# Patient Record
Sex: Female | Born: 1955
Health system: Southern US, Community
[De-identification: ages and names within clinical notes are randomized; demographics above are authoritative.]

## PROBLEM LIST (undated history)

## (undated) DIAGNOSIS — E785 Hyperlipidemia, unspecified: Secondary | ICD-10-CM

## (undated) DIAGNOSIS — E079 Disorder of thyroid, unspecified: Secondary | ICD-10-CM

## (undated) DIAGNOSIS — M1990 Unspecified osteoarthritis, unspecified site: Secondary | ICD-10-CM

## (undated) HISTORY — PX: JOINT REPLACEMENT: SHX530

## (undated) HISTORY — DX: Unspecified osteoarthritis, unspecified site: M19.90

## (undated) HISTORY — DX: Disorder of thyroid, unspecified: E07.9

## (undated) HISTORY — PX: TUBAL LIGATION: SHX77

## (undated) HISTORY — DX: Hyperlipidemia, unspecified: E78.5

---

## 1991-05-01 ENCOUNTER — Encounter: Payer: Self-pay | Admitting: Gastroenterology

## 1991-05-04 ENCOUNTER — Encounter: Payer: Self-pay | Admitting: Gastroenterology

## 2001-01-30 ENCOUNTER — Other Ambulatory Visit: Admission: RE | Admit: 2001-01-30 | Discharge: 2001-01-30 | Payer: Self-pay | Admitting: Family Medicine

## 2002-01-31 ENCOUNTER — Other Ambulatory Visit: Admission: RE | Admit: 2002-01-31 | Discharge: 2002-01-31 | Payer: Self-pay | Admitting: Family Medicine

## 2002-02-20 ENCOUNTER — Encounter: Admission: RE | Admit: 2002-02-20 | Discharge: 2002-02-20 | Payer: Self-pay | Admitting: Family Medicine

## 2002-02-20 ENCOUNTER — Encounter: Payer: Self-pay | Admitting: Family Medicine

## 2004-01-17 ENCOUNTER — Ambulatory Visit: Payer: Self-pay | Admitting: Family Medicine

## 2004-03-10 ENCOUNTER — Other Ambulatory Visit: Admission: RE | Admit: 2004-03-10 | Discharge: 2004-03-10 | Payer: Self-pay | Admitting: Family Medicine

## 2004-03-10 ENCOUNTER — Ambulatory Visit: Payer: Self-pay | Admitting: Family Medicine

## 2004-03-10 ENCOUNTER — Encounter: Admission: RE | Admit: 2004-03-10 | Discharge: 2004-03-10 | Payer: Self-pay | Admitting: Family Medicine

## 2004-07-03 ENCOUNTER — Ambulatory Visit: Payer: Self-pay | Admitting: Family Medicine

## 2005-12-21 ENCOUNTER — Encounter: Payer: Self-pay | Admitting: Family Medicine

## 2005-12-21 ENCOUNTER — Other Ambulatory Visit: Admission: RE | Admit: 2005-12-21 | Discharge: 2005-12-21 | Payer: Self-pay | Admitting: Family Medicine

## 2005-12-21 ENCOUNTER — Ambulatory Visit: Payer: Self-pay | Admitting: Family Medicine

## 2006-01-12 ENCOUNTER — Ambulatory Visit: Payer: Self-pay | Admitting: Family Medicine

## 2006-01-17 ENCOUNTER — Encounter: Admission: RE | Admit: 2006-01-17 | Discharge: 2006-01-17 | Payer: Self-pay | Admitting: Family Medicine

## 2007-02-21 ENCOUNTER — Ambulatory Visit: Payer: Self-pay | Admitting: Family Medicine

## 2007-03-14 ENCOUNTER — Telehealth: Payer: Self-pay | Admitting: Family Medicine

## 2007-03-16 ENCOUNTER — Telehealth: Payer: Self-pay | Admitting: Family Medicine

## 2007-03-20 ENCOUNTER — Ambulatory Visit: Payer: Self-pay | Admitting: Family Medicine

## 2007-07-28 ENCOUNTER — Encounter: Payer: Self-pay | Admitting: Family Medicine

## 2007-07-29 ENCOUNTER — Encounter: Payer: Self-pay | Admitting: Family Medicine

## 2007-08-02 ENCOUNTER — Encounter: Payer: Self-pay | Admitting: Family Medicine

## 2007-08-02 DIAGNOSIS — E785 Hyperlipidemia, unspecified: Secondary | ICD-10-CM | POA: Insufficient documentation

## 2007-08-02 DIAGNOSIS — L259 Unspecified contact dermatitis, unspecified cause: Secondary | ICD-10-CM

## 2007-08-02 DIAGNOSIS — N6019 Diffuse cystic mastopathy of unspecified breast: Secondary | ICD-10-CM | POA: Insufficient documentation

## 2007-08-03 ENCOUNTER — Ambulatory Visit: Payer: Self-pay | Admitting: Family Medicine

## 2007-08-04 LAB — CONVERTED CEMR LAB
LH: 48.2 milliintl units/mL
hCG, Beta Chain, Quant, S: 6.3 milliintl units/mL

## 2007-08-17 ENCOUNTER — Ambulatory Visit: Payer: Self-pay | Admitting: Obstetrics & Gynecology

## 2007-08-23 ENCOUNTER — Encounter (INDEPENDENT_AMBULATORY_CARE_PROVIDER_SITE_OTHER): Payer: Self-pay | Admitting: *Deleted

## 2007-08-23 ENCOUNTER — Encounter: Admission: RE | Admit: 2007-08-23 | Discharge: 2007-08-23 | Payer: Self-pay | Admitting: Family Medicine

## 2007-08-31 ENCOUNTER — Ambulatory Visit (HOSPITAL_COMMUNITY): Admission: RE | Admit: 2007-08-31 | Discharge: 2007-08-31 | Payer: Self-pay | Admitting: Gynecology

## 2007-09-26 ENCOUNTER — Telehealth: Payer: Self-pay | Admitting: Family Medicine

## 2007-09-28 ENCOUNTER — Ambulatory Visit: Payer: Self-pay | Admitting: Obstetrics & Gynecology

## 2007-10-05 ENCOUNTER — Telehealth (INDEPENDENT_AMBULATORY_CARE_PROVIDER_SITE_OTHER): Payer: Self-pay | Admitting: *Deleted

## 2008-10-15 ENCOUNTER — Ambulatory Visit: Payer: Self-pay | Admitting: Family Medicine

## 2008-12-04 ENCOUNTER — Ambulatory Visit: Payer: Self-pay | Admitting: Family Medicine

## 2008-12-05 LAB — CONVERTED CEMR LAB
Basophils Relative: 0.6 % (ref 0.0–3.0)
Eosinophils Absolute: 0.1 10*3/uL (ref 0.0–0.7)
HCT: 40.6 % (ref 36.0–46.0)
Hemoglobin: 14 g/dL (ref 12.0–15.0)
Monocytes Absolute: 0.6 10*3/uL (ref 0.1–1.0)
Neutrophils Relative %: 64.7 % (ref 43.0–77.0)
RDW: 12.7 % (ref 11.5–14.6)
WBC: 7.5 10*3/uL (ref 4.5–10.5)

## 2008-12-19 ENCOUNTER — Encounter: Payer: Self-pay | Admitting: Family Medicine

## 2009-02-15 DIAGNOSIS — K863 Pseudocyst of pancreas: Secondary | ICD-10-CM

## 2009-02-15 HISTORY — DX: Pseudocyst of pancreas: K86.3

## 2009-03-10 ENCOUNTER — Ambulatory Visit: Payer: Self-pay | Admitting: Family Medicine

## 2009-03-10 LAB — CONVERTED CEMR LAB
Bilirubin Urine: NEGATIVE
Nitrite: NEGATIVE
Specific Gravity, Urine: 1.025
Urobilinogen, UA: 0.2
WBC Urine, dipstick: NEGATIVE
pH: 6

## 2009-03-11 ENCOUNTER — Encounter: Payer: Self-pay | Admitting: Family Medicine

## 2009-03-13 ENCOUNTER — Ambulatory Visit: Payer: Self-pay | Admitting: Family Medicine

## 2009-03-13 DIAGNOSIS — R3129 Other microscopic hematuria: Secondary | ICD-10-CM

## 2009-03-13 LAB — CONVERTED CEMR LAB
Nitrite: NEGATIVE
Specific Gravity, Urine: >=1.03
Urobilinogen, UA: 0.2
WBC Urine, dipstick: NEGATIVE

## 2009-03-25 ENCOUNTER — Encounter: Payer: Self-pay | Admitting: Family Medicine

## 2009-03-28 ENCOUNTER — Encounter: Payer: Self-pay | Admitting: Family Medicine

## 2009-03-31 ENCOUNTER — Telehealth: Payer: Self-pay | Admitting: Family Medicine

## 2009-04-03 ENCOUNTER — Telehealth: Payer: Self-pay | Admitting: Internal Medicine

## 2009-04-07 ENCOUNTER — Ambulatory Visit: Payer: Self-pay | Admitting: Internal Medicine

## 2009-04-07 ENCOUNTER — Encounter (INDEPENDENT_AMBULATORY_CARE_PROVIDER_SITE_OTHER): Payer: Self-pay | Admitting: *Deleted

## 2009-04-07 DIAGNOSIS — R933 Abnormal findings on diagnostic imaging of other parts of digestive tract: Secondary | ICD-10-CM | POA: Insufficient documentation

## 2009-04-08 ENCOUNTER — Telehealth (INDEPENDENT_AMBULATORY_CARE_PROVIDER_SITE_OTHER): Payer: Self-pay | Admitting: *Deleted

## 2009-04-15 ENCOUNTER — Other Ambulatory Visit: Admission: RE | Admit: 2009-04-15 | Discharge: 2009-04-15 | Payer: Self-pay | Admitting: Family Medicine

## 2009-04-15 ENCOUNTER — Ambulatory Visit: Payer: Self-pay | Admitting: Family Medicine

## 2009-04-17 LAB — CONVERTED CEMR LAB
ALT: 23 units/L (ref 0–35)
AST: 19 units/L (ref 0–37)
Basophils Relative: 0.7 % (ref 0.0–3.0)
Bilirubin, Direct: 0.2 mg/dL (ref 0.0–0.3)
Chloride: 107 meq/L (ref 96–112)
Direct LDL: 164.4 mg/dL
Eosinophils Relative: 0.7 % (ref 0.0–5.0)
GFR calc non Af Amer: 92.58 mL/min (ref >60)
HCT: 40.8 % (ref 36.0–46.0)
Lymphs Abs: 1.6 10*3/uL (ref 0.7–4.0)
MCV: 89 fL (ref 78.0–100.0)
Monocytes Absolute: 0.4 10*3/uL (ref 0.1–1.0)
Monocytes Relative: 5.5 % (ref 3.0–12.0)
Neutrophils Relative %: 70.5 % (ref 43.0–77.0)
Potassium: 3.7 meq/L (ref 3.5–5.1)
RBC: 4.58 M/uL (ref 3.87–5.11)
Total CHOL/HDL Ratio: 4
Total Protein: 7.1 g/dL (ref 6.0–8.3)
VLDL: 17.2 mg/dL (ref 0.0–40.0)
WBC: 7 10*3/uL (ref 4.5–10.5)

## 2009-04-22 ENCOUNTER — Ambulatory Visit (HOSPITAL_COMMUNITY): Admission: RE | Admit: 2009-04-22 | Discharge: 2009-04-22 | Payer: Self-pay | Admitting: Gastroenterology

## 2009-04-22 ENCOUNTER — Encounter: Payer: Self-pay | Admitting: Family Medicine

## 2009-04-22 ENCOUNTER — Ambulatory Visit: Payer: Self-pay | Admitting: Gastroenterology

## 2009-04-23 ENCOUNTER — Encounter: Payer: Self-pay | Admitting: Gastroenterology

## 2009-04-30 ENCOUNTER — Telehealth: Payer: Self-pay | Admitting: Internal Medicine

## 2009-05-01 ENCOUNTER — Encounter: Payer: Self-pay | Admitting: Gastroenterology

## 2009-05-05 ENCOUNTER — Encounter (INDEPENDENT_AMBULATORY_CARE_PROVIDER_SITE_OTHER): Payer: Self-pay | Admitting: *Deleted

## 2009-05-08 ENCOUNTER — Encounter (INDEPENDENT_AMBULATORY_CARE_PROVIDER_SITE_OTHER): Payer: Self-pay | Admitting: *Deleted

## 2009-05-09 ENCOUNTER — Telehealth: Payer: Self-pay | Admitting: Internal Medicine

## 2009-05-28 ENCOUNTER — Encounter (INDEPENDENT_AMBULATORY_CARE_PROVIDER_SITE_OTHER): Payer: Self-pay | Admitting: *Deleted

## 2009-05-30 ENCOUNTER — Ambulatory Visit: Payer: Self-pay | Admitting: Internal Medicine

## 2009-06-04 ENCOUNTER — Ambulatory Visit: Payer: Self-pay | Admitting: Internal Medicine

## 2009-06-04 HISTORY — PX: COLONOSCOPY: SHX174

## 2009-08-14 ENCOUNTER — Ambulatory Visit: Payer: Self-pay | Admitting: Family Medicine

## 2009-08-14 LAB — CONVERTED CEMR LAB
ALT: 19 units/L (ref 0–35)
Cholesterol: 237 mg/dL — ABNORMAL HIGH (ref 0–200)
Total CHOL/HDL Ratio: 5
Triglycerides: 86 mg/dL (ref 0.0–149.0)
VLDL: 17.2 mg/dL (ref 0.0–40.0)

## 2009-08-27 ENCOUNTER — Ambulatory Visit: Payer: Self-pay | Admitting: Family Medicine

## 2009-09-24 ENCOUNTER — Ambulatory Visit: Payer: Self-pay | Admitting: Family Medicine

## 2009-09-24 DIAGNOSIS — R21 Rash and other nonspecific skin eruption: Secondary | ICD-10-CM

## 2009-10-13 ENCOUNTER — Telehealth: Payer: Self-pay | Admitting: Family Medicine

## 2009-10-16 ENCOUNTER — Ambulatory Visit: Payer: Self-pay | Admitting: Family Medicine

## 2009-11-27 ENCOUNTER — Ambulatory Visit: Payer: Self-pay | Admitting: Family Medicine

## 2009-12-02 LAB — CONVERTED CEMR LAB
ALT: 20 units/L (ref 0–35)
AST: 20 units/L (ref 0–37)
Cholesterol: 212 mg/dL — ABNORMAL HIGH (ref 0–200)
Direct LDL: 154.9 mg/dL
Total CHOL/HDL Ratio: 5
VLDL: 15.4 mg/dL (ref 0.0–40.0)

## 2009-12-05 ENCOUNTER — Telehealth: Payer: Self-pay | Admitting: Internal Medicine

## 2009-12-08 ENCOUNTER — Ambulatory Visit: Payer: Self-pay | Admitting: Internal Medicine

## 2009-12-08 DIAGNOSIS — R1011 Right upper quadrant pain: Secondary | ICD-10-CM | POA: Insufficient documentation

## 2009-12-08 LAB — CONVERTED CEMR LAB: BUN: 14 mg/dL (ref 6–23)

## 2009-12-11 ENCOUNTER — Telehealth: Payer: Self-pay | Admitting: Internal Medicine

## 2009-12-25 ENCOUNTER — Ambulatory Visit (HOSPITAL_COMMUNITY)
Admission: RE | Admit: 2009-12-25 | Discharge: 2009-12-25 | Payer: Self-pay | Source: Home / Self Care | Admitting: Internal Medicine

## 2009-12-29 ENCOUNTER — Telehealth: Payer: Self-pay | Admitting: Internal Medicine

## 2009-12-31 ENCOUNTER — Encounter (INDEPENDENT_AMBULATORY_CARE_PROVIDER_SITE_OTHER): Payer: Self-pay | Admitting: *Deleted

## 2009-12-31 DIAGNOSIS — K863 Pseudocyst of pancreas: Secondary | ICD-10-CM

## 2009-12-31 DIAGNOSIS — K862 Cyst of pancreas: Secondary | ICD-10-CM | POA: Insufficient documentation

## 2010-01-01 ENCOUNTER — Telehealth: Payer: Self-pay | Admitting: Internal Medicine

## 2010-01-15 ENCOUNTER — Encounter: Payer: Self-pay | Admitting: Gastroenterology

## 2010-01-15 ENCOUNTER — Ambulatory Visit (HOSPITAL_COMMUNITY)
Admission: RE | Admit: 2010-01-15 | Discharge: 2010-01-15 | Payer: Self-pay | Source: Home / Self Care | Admitting: Gastroenterology

## 2010-01-15 HISTORY — PX: UPPER ESOPHAGEAL ENDOSCOPIC ULTRASOUND (EUS): SHX6562

## 2010-02-24 ENCOUNTER — Telehealth: Payer: Self-pay | Admitting: Internal Medicine

## 2010-03-17 NOTE — Letter (Signed)
Summary: Previsit letter  [facility]  [address] [place], [place] [address]   Phone: [phone]  Fax: [phone]       05/05/2009 MRN: [id number]  [facility] [address] [place], [place]  [address]  Dear Ms. Witherington,  Welcome to the Gastroenterology Division at [facility].    You are scheduled to see a nurse for your pre-procedure visit on 05/30/09 at 1:00 PM on the 3rd floor at [facility], [address]  We ask that you try to arrive at our office 15 minutes prior to your appointment time to allow for check-in.  Your nurse visit will consist of discussing your medical and surgical history, your immediate family medical history, and your medications.    Please bring a complete list of all your medications or, if you prefer, bring the medication bottles and we will list them.  We will need to be aware of both prescribed and over the counter drugs.  We will need to know exact dosage information as well.  If you are on blood thinners (Coumadin, Plavix, Aggrenox, Ticlid, etc.) please call our office today/prior to your appointment, as we need to consult with your physician about holding your medication.   Please be prepared to read and sign documents such as consent forms, a financial agreement, and acknowledgement forms.  If necessary, and with your consent, a friend or relative is welcome to sit-in on the nurse visit with you.  Please bring your insurance card so that we may make a copy of it.  If your insurance requires a referral to see a specialist, please bring your referral form from your primary care physician.  No co-pay is required for this nurse visit.     If you cannot keep your appointment, please call [phone] to cancel or reschedule prior to your appointment date.  This allows [place] the opportunity to schedule an appointment for another patient in need of care.    Thank you for choosing [facility] for your  medical needs.  We appreciate the opportunity to care for you.  Please visit [place] at our website  to learn more about our practice.                     Sincerely.                                                                                                                   The Gastroenterology Division

## 2010-03-17 NOTE — Letter (Signed)
Summary: [facility] Instructions  [facility]  [address] [place], [place] [address]   Phone: [phone]  Fax: [phone]       Nicole [name]    56-Mar-1957    MRN: [id number]        Procedure Day Dorna Bloom:  [facility]  06/04/09     Arrival Time:  10:30AM     Procedure Time:  11:30AM     Location of Procedure:                    _X _   Endoscopy Center (4th Floor)                        PREPARATION FOR COLONOSCOPY WITH MOVIPREP   Starting 5 days prior to your procedure 05/30/09 do not eat nuts, seeds, popcorn, corn, beans, peas,  salads, or any raw vegetables.  Do not take any fiber supplements (e.g. Metamucil, Citrucel, and Benefiber).  THE DAY BEFORE YOUR PROCEDURE         DATE: 06/03/09  DAY: TUESDAY  1.  Drink clear liquids the entire day-NO SOLID FOOD  2.  Do not drink anything colored red or purple.  Avoid juices with pulp.  No orange juice.  3.  Drink at least 64 oz. (8 glasses) of fluid/clear liquids during the day to prevent dehydration and help the prep work efficiently.  CLEAR LIQUIDS INCLUDE: Water Jello Ice Popsicles Tea (sugar ok, no milk/cream) Powdered fruit flavored drinks Coffee (sugar ok, no milk/cream) Gatorade Juice: apple, white grape, white cranberry  Lemonade Clear bullion, consomm, broth Carbonated beverages (any kind) Strained chicken noodle soup Hard Candy                             4.  In the morning, mix first dose of MoviPrep solution:    Empty 1 Pouch A and 1 Pouch B into the disposable container    Add lukewarm drinking water to the top line of the container. Mix to dissolve    Refrigerate (mixed solution should be used within 24 hrs)  5.  Begin drinking the prep at 5:00 p.m. The MoviPrep container is divided by 4 marks.   Every 15 minutes drink the solution down to the next mark (approximately 8 oz) until the full liter is complete.   6.  Follow completed prep with 16 oz of clear liquid of your choice  (Nothing red or purple).  Continue to drink clear liquids until bedtime.  7.  Before going to bed, mix second dose of MoviPrep solution:    Empty 1 Pouch A and 1 Pouch B into the disposable container    Add lukewarm drinking water to the top line of the container. Mix to dissolve    Refrigerate  THE DAY OF YOUR PROCEDURE      DATE: 06/04/09 DAY: WEDNESDAY  Beginning at 6:30AM (5 hours before procedure):         1. Every 15 minutes, drink the solution down to the next mark (approx 8 oz) until the full liter is complete.  2. Follow completed prep with 16 oz. of clear liquid of your choice.    3. You may drink clear liquids until 9:30AM (2 HOURS BEFORE PROCEDURE).   MEDICATION INSTRUCTIONS  Unless otherwise instructed, you should take regular prescription medications with a small sip of water   as early as possible the morning of  your procedure.        OTHER INSTRUCTIONS  You will need a responsible adult at least 56 years of age to accompany you and drive you home.   This person must remain in the waiting room during your procedure.  Wear loose fitting clothing that is easily removed.  Leave jewelry and other valuables at home.  However, you may wish to bring a book to read or  an iPod/MP3 player to listen to music as you wait for your procedure to start.  Remove all body piercing jewelry and leave at home.  Total time from sign-in until discharge is approximately 2-3 hours.  You should go home directly after your procedure and rest.  You can resume normal activities the  day after your procedure.  The day of your procedure you should not:   Drive   Make legal decisions   Operate machinery   Drink alcohol   Return to work  You will receive specific instructions about eating, activities and medications before you leave.    The above instructions have been reviewed and explained to me by   Wyona Almas RN  May 30, 2009 2:25 PM     I fully understand  and can verbalize these instructions _____________________________ Date _________

## 2010-03-17 NOTE — Procedures (Signed)
Summary: Endoscopic Ultrasound  Patient: Nicole [name] Note: All result statuses are Final unless otherwise noted.  Tests: (1) Endoscopic Ultrasound (EUS)  EUS Endoscopic Ultrasound                             DONE     [facility]     [address] [place], [place]  [address]           ENDOSCOPIC ULTRASOUND PROCEDURE REPORT           PATIENT:  Nicole, [name]  MR#:  [id number]     BIRTHDATE:  [birth year]  GENDER:  female     ENDOSCOPIST:  Rachael Fee, MD     REFERRED BY:  Iva Boop, M.D., [facility]     PROCEDURE DATE:  04/22/2009     PROCEDURE:  Upper EUS w/FNA     ASA CLASS:  Class II     INDICATIONS:  incidentally noted 2-3cm cyst near     pancreas/stomach/liver.  Not clearly arising from any of those     structures on CT.     MEDICATIONS:   Fentanyl 100 mcg IV, Versed 9 mg IV, Cipro 400mg  IV           DESCRIPTION OF PROCEDURE:   After the risks, benefits, and     alternatives of the procedure were thoroughly explained, informed     consent was obtained.  The  endoscope was introduced through the     mouth and advanced to the duodenum.     <<PROCEDUREIMAGES>>           Endoscopic findings:     1. Normal esophagus     2. Mild non-specific gastritis     3. Normal duodenum           EUS findings:     1. 2.8cm by 2.5cm anechoeic (cystic) lesion that lays in very     close proximity to neck of pancreas, liver, gastric wall.  The     cyst does not clearly involve any of those structures however.     The cyst has no internal septea, no associated nodules or masses.     The cyst fluid was completely aspirated using a single pass of a 22     gauge Expect EUS FNA needle.  5cc of very thin, yellowish fluid     was aspirated and sent to cytology and Red Path (labeled as a     "pancreatic cyst" however it is not clear that this cyst indeed     originates from pancreas.     2. CBD normal, non-dilated     3. Main pancreatic duct, normal, non-dilated     4.  Gallbladder normal     5. Pancreatic parenchyma was normal without masses or signs of     chronic pancreatitis     6. Limited views of liver, spleen, portal and splenic vessels were     all normal.     7. No peripancreatic or celiac adenopathy.           Impression:     2.8cm by 2.5cm cyst that lays in close proximity to     pancreas/liver/stomach but does not clearly involve any of those     structures.  The cyst was completely aspirated and fluid sent for     cytology and Red Path analysis.     My  office will contact patient about imaging done about 20 years     ago in [place] that may have shown some "liver abnormality" so that     we can review those records.  Await final cyst fluid analysis and     [place] records for recommendations.  She will complete 3 days of     cipro twice daily.           ______________________________     Rachael Fee, MD           n.     eSIGNED:   Rachael Fee at 04/22/2009 09:50 AM           Laure Kidney, [id number]  Note: An exclamation mark (!) indicates a result that was not dispersed into the flowsheet. Document Creation Date: 04/22/2009 9:50 AM _______________________________________________________________________  (1) Order result status: Final Collection or observation date-time: 04/22/2009 09:29 Requested date-time:  Receipt date-time:  Reported date-time:  Referring Physician:   Ordering Physician: Rob Bunting [id number]) Specimen Source:  Source: Launa Grill Order Number: [id number] Lab site:   Appended Document: Endoscopic Ultrasound patty, can you call her tomorrow, find out about imaging testing she had 15-20 years ago in [place].  We would like to get copies of any abdominal MRI or CT or US done back then (she remembers something was seen, was told it was on her liver and was nothing to worry about).  Appended Document: Endoscopic Ultrasound pt was seen at [facility] in March 1993  Dr Gar Gibbon, her primary care  was Dr Farris Has.  She will come by the office today to sign a release for records.

## 2010-03-17 NOTE — Progress Notes (Signed)
Summary: EUS change  Phone Note Outgoing Call Call back at [facility] Phone [phone]   Call placed by: Chales Abrahams CMA Duncan Dull),  April 08, 2009 9:30 AM Summary of Call: left message on machine to call back regarding change of appt date and time.  04/22/09 @ 8:30 am Initial call taken by: Chales Abrahams CMA Duncan Dull),  April 08, 2009 9:31 AM  Follow-up for Phone Call        Called pt work number and she has been reinstructed with the correct date and time.   Follow-up by: Chales Abrahams CMA Duncan Dull),  April 08, 2009 9:32 AM

## 2010-03-17 NOTE — Assessment & Plan Note (Signed)
Summary: cyst on pancreas   History of Present Illness Visit Type: Initial Consult Primary GI MD: Stan Head MD [facility] Primary Provider: [facility] Requesting Provider: [facility] Chief Complaint: Lesion on CT abd/pelvis History of Present Illness:   55 yo white woman that developed a dull flank-like pain in Jan 2011 and had hematuria at PCP. She saw Dr. Aldean Ast and no hematuria. CT abd/pelvis without and with iv contrast found a ? cysytic lesion at pancreatic gastro-hepatic interface. She still has a constabt low-grade pain in right flank pain. Urinary symptoms of dysuria better/resolved. PCP, GU records and CT report/images viewed today. All other GI ROS negative.            Current Medications (verified): 1)  Ibuprofen 200 Mg Tabs (Ibuprofen) .... Otc As Directed.  Allergies (verified): No Known Drug Allergies  Past History:  Past Medical History: Hyperlipidemia Hypertension 17 years ago  Past Surgical History: Reviewed history from 08/02/2007 and no changes required. Caesarean section x2 Pre- eclampsia (1995) Miscarriage (1988) Tubal ligation Pelvic US- 11 mm uterine stripe (02/2004) Ovarian cyst  Family History: Reviewed history from 08/02/2007 and no changes required. Father: deceased- has MI x 2, lymphatic cancer, obese Mother: uterine cancer age 15 Siblings: sister has had lymph nodes removed, no cancer  Social History: Never Smoked Marital Status:  Children: 2 Occupation: [employer] married Alcohol Use - yes  occ Illicit Drug Use - no Drug Use:  no  Review of Systems       The patient complains of urination - excessive.         All other ROS negative except as per HPI.   Vital Signs:  Patient profile:   55 year old female Height:      64.25 inches Weight:      215 pounds BMI:     36.75 BSA:     2.02 Pulse rate:   104 / minute Pulse rhythm:   regular BP sitting:   114 / 80  (left arm)  Vitals Entered By: Merri Ray CMA Duncan Dull) (April 07, 2009 3:44 PM)  Physical Exam  General:  obese NAD Head:  Normocephalic and atraumatic. Eyes:  PERRLA, no icterus. Mouth:  No deformity or lesions, dentition normal. Lungs:  Clear throughout to auscultation. Heart:  Regular rate and rhythm; no murmurs, rubs,  or bruits. Abdomen:  Soft, nontender and nondistended. No masses, hepatosplenomegaly or hernias noted. Normal bowel sounds. Obese Extremities:  No clubbing, cyanosis, edema or deformities noted. Neurologic:  Alert and  oriented x4;  grossly normal neurologically. Cervical Nodes:  No significant cervical or supraclavicular adenopathy.  Psych:  Alert and cooperative. Normal mood and affect.   Impression & Recommendations:  Problem # 1:  NONSPECIFIC ABN FINDING RAD & OTH EXAM GI TRACT (ICD-793.4) Assessment New 3.1 x 2.3 cm superior pancreatic gastro-hepatic interface cystic lesion of questionable etiology. seems unusual for pancreatic cancer though some type of malignancy is in differential. Family history of lymphoma noted. Mother had uterine cancer at young age. This needs further evaluation and she will be scheduled for endoscpic ultrasound and FNA. i have explained the procedure and risks/benefits. Have reviewed with Dr. Wendall Papa also.Images reviewed with patient.  She did note that a number of years ago she had a CT or MRI and was told she had a liver lesion or lesions. She does have liver lesions that seem to be benign (? cystst) on CT.  Problem # 2:  FLANK PAIN, RIGHT (ICD-789.09) Assessment:  Unchanged New to GI eval. Etiology not clear. Would be unusual but not impossible to be from the lesion seen on CT, I think. Await EUS.  She is tolerating the pain.  Problem # 3:  SCREENING, COLON CANCER (ICD-V76.51) Assessment: Comment Only She has not yet had a colonoscopy. Await EUS results and plans before broaching this.  Patient Instructions: 1)  We will see you at your procedure on  05/01/09 with Dr. Christella Hartigan @ [facility]. 2)  Please call our office with any questions. 3)  Copy sent to : Roxy Manns, MD, Vic Blackbird, MD 4)  The medication list was reviewed and reconciled.  All changed / newly prescribed medications were explained.  A complete medication list was provided to the patient / caregiver.  Appended Document: Orders Update Clinical Lists Changes  Orders: Added new Test order of EUS-Upper (EUS-Upper) - Signed    Appended Document: cyst on pancreas please remiond her she should schedule a screening colonoscopy with me  Appended Document: cyst on pancreas screening colon scheduled for 06/04/09

## 2010-03-17 NOTE — Progress Notes (Signed)
Summary: cyst is stable  Phone Note Outgoing Call   Summary of Call: cyst is stable no growth is good news - benign etiology is favored will review with Dr. Christella Hartigan also and re contact her is she still having symptoms - hard to know but doubt this is causing her symptoms Iva Boop MD, [facility]  December 29, 2009 7:56 AM   Follow-up for Phone Call        LM to [facility] at home number Francee Piccolo CMA Duncan Dull)  December 29, 2009 2:07 PM   RC from pt.  I advised her of above.  She is agreeable with plan and will wait for additional plans.  Pt states her pain is still there.  It is not constant.  She does state that her symptoms were better when she was eating less red meat and cheese earlier in the year for cholesterol reasons.  Water seems to make the pain better.  Pt also states that her pain went away completely for about two months after cyst was drained during EUS.  Pt will wait for [place] to re contact her and if she has not heard anything by middle of next week she will call us. Follow-up by: Francee Piccolo CMA Duncan Dull),  December 30, 2009 9:04 AM  Additional Follow-up for Phone Call Additional follow up Details #1::        Dr. Christella Hartigan will have Patty contact her for an EUS and aspiration again Iva Boop MD, [facility]  December 30, 2009 2:25 PM   New Problems: CYST AND PSEUDOCYST OF PANCREAS (ICD-577.2)   Additional Follow-up for Phone Call Additional follow up Details #2::    patty, she needs upper EUS 60 min radial +/- linear, pancreatic cyst, next avail EUS thursday  Dr Christella Hartigan sent message will schedule and call pt Follow-up by: Chales Abrahams CMA Duncan Dull),  December 30, 2009 2:40 PM  Additional Follow-up for Phone Call Additional follow up Details #3:: Details for Additional Follow-up Action Taken: pt scheduled for EUS need to review meds and instruct pt, instructions mailed to the .pt left message on machine to call back  Additional Follow-up by: Chales Abrahams CMA Duncan Dull),  December 31, 2009 11:17 AM  New Problems: CYST AND PSEUDOCYST OF PANCREAS (ICD-577.2) I did speak wiht the patient and have reviewed EUS instructions with her  Darcey Nora RN, [facility]  January 01, 2010 9:13 AM

## 2010-03-17 NOTE — Assessment & Plan Note (Signed)
Summary: ?UTI/CLE   Vital Signs:  Patient profile:   55 year old female Height:      64.25 inches Weight:      219.38 pounds BMI:     37.50 Temp:     97.7 degrees F oral Pulse rate:   76 / minute Pulse rhythm:   regular BP sitting:   144 / 80  (left arm) Cuff size:   regular  Vitals Entered By: Delilah Shan CMA Duncan Dull) (March 10, 2009 2:34 PM) CC: ? UTI   History of Present Illness: 55 yo with 2 1/2 weeks of:  Right suprapubic aching. Moved to right back and now has increased urinary frequency and mild dysuria. No hematuria. No fevers or chills. No n/v/d.  No h/o kidney stones, never had a UTI. Feels like symptoms are getting progressively worse.    Current Medications (verified): 1)  Ibuprofen 200 Mg Tabs (Ibuprofen) .... Otc As Directed. 2)  Cipro 500 Mg Tabs (Ciprofloxacin Hcl) .Marland Kitchen.. 1 By Mouth 2 Times Daily X 7 Days  Allergies (verified): No Known Drug Allergies  Review of Systems      See HPI General:  Denies chills, fever, and malaise. GI:  Denies nausea and vomiting. GU:  Complains of dysuria and urinary frequency; denies discharge, hematuria, incontinence, and urinary hesitancy.  Physical Exam  General:  Well-developed,well-nourished,in no acute distress; alert,appropriate and cooperative throughout examination Mouth:  MMM Abdomen:  mild right suprapubic tenderness Pos right CVA tenderness Psych:  normal affect, talkative and pleasant    Impression & Recommendations:  Problem # 1:  DYSURIA (ICD-788.1) Assessment New UTI vs nephrolithiasis.  No LE or nitrites on UA but it is pos for blood. Will send for culture and treat for pyelo with cipro. If culture neg, will repeat UA and send for hematuria work up. Her updated medication list for this problem includes:    Cipro 500 Mg Tabs (Ciprofloxacin hcl) .Marland Kitchen... 1 by mouth 2 times daily x 7 days  Orders: T-Culture, Urine ([id number]) UA Dipstick w/o Micro (manual) (44010)  Complete Medication  List: 1)  Ibuprofen 200 Mg Tabs (Ibuprofen) .... Otc as directed. 2)  Cipro 500 Mg Tabs (Ciprofloxacin hcl) .Marland Kitchen.. 1 by mouth 2 times daily x 7 days Prescriptions: CIPRO 500 MG TABS (CIPROFLOXACIN HCL) 1 by mouth 2 times daily x 7 days  #14 x 0   Entered and Authorized by:   Ruthe Mannan MD   Signed by:   Ruthe Mannan MD on 03/10/2009   Method used:   Elect[facility] to        [facility] [id number] (retail[facility]       [address]       [place], [place]  [address]       Ph: [phone] or [phone]       Fax: [phone]   RxID:   [id number]   Current Allergies (reviewed today): No known allergies   Laboratory Results   Urine Tests   Date/Time Reported: March 10, 2009 2:50 PM   Routine Urinalysis   Color: yellow Appearance: Clear Glucose: negative   (Normal Range: Negative) Bilirubin: negative   (Normal Range: Negative) Ketone: negative   (Normal Range: Negative) Spec. Gravity: 1.025   (Normal Range: 1.003-1.035) Blood: trace-intact   (Normal Range: Negative) pH: 6.0   (Normal Range: 5.0-8.0) Protein: negative   (Normal Range: Negative) Urobilinogen: 0.2   (Normal Range: 0-1) Nitrite: negative   (Normal Range: Negative) Leukocyte Esterace: negative   (Normal Range: Negative)

## 2010-03-17 NOTE — Miscellaneous (Signed)
Summary: rx  Clinical Lists Changes  Medications: Added new medication of CIPROFLOXACIN HCL 500 MG  TABS (CIPROFLOXACIN HCL) Take 1 twice a day for 3 days - Signed Rx of CIPROFLOXACIN HCL 500 MG  TABS (CIPROFLOXACIN HCL) Take 1 twice a day for 3 days;  #6 x 0;  Signed;  Entered by: Rachael Fee MD;  Authorized by: Rachael Fee MD;  Method used: Print then Give to Patient    Prescriptions: CIPROFLOXACIN HCL 500 MG  TABS (CIPROFLOXACIN HCL) Take 1 twice a day for 3 days  #6 x 0   Entered and Authorized by:   Rachael Fee MD   Signed by:   Rachael Fee MD on 04/22/2009   Method used:   Print then Give to Patient   RxID:   [id number]

## 2010-03-17 NOTE — Miscellaneous (Signed)
Summary: pap smear screening  Clinical Lists Changes  Observations: Added new observation of PAP SMEAR: normal (04/15/2009 17:31)      Preventive Care Screening  Pap Smear:    Date:  04/15/2009    Results:  normal

## 2010-03-17 NOTE — Progress Notes (Signed)
Summary: regarding nose infection  Phone Note Call from Patient Call back at [phone]   Caller: Patient Summary of Call: Pt was treated earlier in the month for an infection in her nose.  This got better with the doxycycline, but she finished this about 4 days ago, and now she feels the itching again.  She doesnt want this to get as bad as it did last time, and she is asking if more abx can be called to [facility]. Initial call taken by: Lowella Petties CMA,  October 13, 2009 9:24 AM  Follow-up for Phone Call        needs to be seen. Ruthe Mannan MD  October 13, 2009 1:51 PM  [facility] to call.           Lowella Petties CMA  October 13, 2009 2:14 PM   Additional Follow-up for Phone Call Additional follow up Details #1::        Advised pt, she cant come in till the end of the week so appt was made with Dr. Milinda Antis. Additional Follow-up by: Lowella Petties CMA,  October 13, 2009 5:04 PM

## 2010-03-17 NOTE — Assessment & Plan Note (Signed)
Summary: right upper abdominal pain/sheri   History of Present Illness Visit Type: Follow-up Visit Primary GI MD: Stan Head MD [facility] Primary Carri Spillers: [facility] Requesting Colby Catanese: na Chief Complaint: Abdominal Pain History of Present Illness:   55 yo wm with hx of right-sided abdominal pain and an intra-abdominal cyst (peri-pancreatic) of unknown etiology.  Recently told she had a high cholesterol and dieted off red meat, cheese, etc. She then returned to eating these and that is when RUQ pain and bulge returned. Dull pain, not severe, not as bad as was before (yet).  No injuries. Some exercise but not different  No urinarysxs   GI Review of Systems    Reports abdominal pain.     Location of  Abdominal pain: RUQ.    Denies acid reflux, belching, bloating, chest pain, dysphagia with liquids, dysphagia with solids, heartburn, loss of appetite, nausea, vomiting, vomiting blood, weight loss, and  weight gain.        Denies anal fissure, black tarry stools, change in bowel habit, constipation, diarrhea, diverticulosis, fecal incontinence, heme positive stool, hemorrhoids, irritable bowel syndrome, jaundice, light color stool, liver problems, rectal bleeding, and  rectal pain.    Current Medications (verified): 1)  None  Allergies (verified): No Known Drug Allergies  Past History:  Past Medical History: Hyperlipidemia Hypertension 17 years ago most likely allergic to dogs with skin condition  derm - Danella Deis  Diverticulosis  Past Surgical History: Reviewed history from 08/27/2009 and no changes required. Caesarean section x2 Pre- eclampsia (1995) Miscarriage (1988) Tubal ligation Pelvic US- 11 mm uterine stripe (02/2004) Ovarian cyst 3/11- GI cyst removed   Family History: Reviewed history from 08/27/2009 and no changes required. Father: deceased- has MI x 2, lymphatic cancer, obese Mother: uterine cancer age 36 Siblings: sister has had lymph nodes removed, no  cancer sister high cholesterol  Social History: Reviewed history from 08/27/2009 and no changes required. 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 Abdomen:  soft and nontender without HSM or mass when standing there is some assymetric fat in Subcutaneously area RUQ Msk:  no CVAT Skin:  slightly flushed   Impression & Recommendations:  Problem # 1:  ABDOMINAL PAIN-RUQ (ICD-789.01) Assessment Deteriorated  recurrence fat bulge RUQ likely functional or muscuolskeletal but reiage cyst MR vs CT?  Orders: TLB-BUN (Urea Nitrogen) (84520-BUN) TLB-Creatinine, Blood (82565-CREA)  Problem # 2:  NONSPECIFIC ABN FINDING RAD & OTH EXAM GI TRACT (ICD-793.4) Assessment: Unchanged  Patient Instructions: 1)  Please go to the basement to have your lab tests drawn today.  2)  We will call you to schedule further testing when we determine which will be the better imaging test-MRI or CT. 3)  The medication list was reviewed and reconciled.  All changed / newly prescribed medications were explained.  A  complete medication list was provided to the patient / caregiver.

## 2010-03-17 NOTE — Procedures (Signed)
Summary: Colonoscopy  Patient: [name] Note: All result statuses are Final unless otherwise noted.  Tests: (1) Colonoscopy (COL)   COL Colonoscopy           DONE     [facility]     [address]     [place], [place]  [address]           COLONOSCOPY PROCEDURE REPORT           PATIENT:  Nicole [name], Nicole [name]  MR#:  [id number]     BIRTHDATE:  06-20-1955, 53 yrs. old  GENDER:  female     ENDOSCOPIST:  Iva Boop, MD, [facility]     REF. BY:  Marne A. Milinda Antis, M.D.     PROCEDURE DATE:  06/04/2009     PROCEDURE:  Colonoscopy 32440     ASA CLASS:  Class I     INDICATIONS:  Routine Risk Screening     MEDICATIONS:   Fentanyl 75 mcg IV, Versed 8 mg IV           DESCRIPTION OF PROCEDURE:   After the risks benefits and     alternatives of the procedure were thoroughly explained, informed     consent was obtained.  Digital rectal exam was performed and     revealed no abnormalities.   The LB [id number] endoscope     was introduced through the anus and advanced to the cecum, which     was identified by both the appendix and ileocecal valve, without     limitations.  The quality of the prep was excellent, using     MoviPrep.  The instrument was then slowly withdrawn as the colon     was fully examined. Insertion: 5:04 minutes Withdrawal: 7:35     minutes     <<PROCEDUREIMAGES>>           FINDINGS:  Severe diverticulosis was found in the sigmoid colon.     This was otherwise a normal examination of the colon.   Retroflexed     views in the rectum revealed internal and external hemorrhoids.     The scope was then withdrawn from the patient and the procedure     completed.           COMPLICATIONS:  None     ENDOSCOPIC IMPRESSION:     1) Severe diverticulosis in the sigmoid colon     2) Internal and external hemorrhoids     3) Otherwise normal examination, excellent prep           REPEAT EXAM:  In 10 year(s) for routine screening colonocsopy.           Iva Boop,  MD, [facility]           CC:  Judy Pimple, MD     The Patient           n.     eSIGNED:   Iva Boop at 06/04/2009 12:22 PM           Laure Kidney, [id number]  Note: An exclamation mark (!) indicates a result that was not dispersed into the flowsheet. Document Creation Date: 06/04/2009 12:22 PM _______________________________________________________________________  (1) Order result status: Final Collection or observation date-time: 06/04/2009 12:13 Requested date-time:  Receipt date-time:  Reported date-time:  Referring Physician:   Ordering Physician: Stan Head [phone]) Specimen Source:  Source: Launa Grill Order Number: [id number] Lab site:   Appended Document: Colonoscopy  Clinical Lists Changes  Observations: Added new observation of COLONNXTDUE: 05/2019 (06/04/2009 14:04)

## 2010-03-17 NOTE — Assessment & Plan Note (Signed)
Summary: ? INFECTION IN NOSE   Vital Signs:  Patient profile:   55 year old female Height:      64.25 inches Weight:      209.75 pounds BMI:     35.85 Temp:     97.8 degrees F oral Pulse rate:   76 / minute Pulse rhythm:   regular BP sitting:   128 / 84  (left arm) Cuff size:   regular  Vitals Entered By: Lewanda Rife LPN (October 16, 2009 11:54 AM) CC: ? infection in nose, itchy deep in tissue of nose and tip of nose red, Pt say Dr Dayton Martes for nose infection and pt finished Doxycycline 1 wk ago.   History of Present Illness: saw Dr Dayton Martes for an infx in/ on nostril -- ? if shingles with bact superinfection or just bacterial improved with doxycycline and now worse again  tip of nose is red again  feels like just in the middle  no fever   no exp to mrsa known and no personal hx    did have some dental work  did x ray at last visit too to make sure all was ok    Allergies (verified): No Known Drug Allergies  Past History:  Past Medical History: Last updated: 04/15/2009 Hyperlipidemia Hypertension 17 years ago most likely allergic to dogs with skin condition     derm Danella Deis   Past Surgical History: Last updated: 09-13-2009 Caesarean section x2 Pre- eclampsia (1995) Miscarriage (1988) Tubal ligation Pelvic US- 11 mm uterine stripe (02/2004) Ovarian cyst 3/11- GI cyst removed   Family History: Last updated: 09/13/09 Father: deceased- has MI x 2, lymphatic cancer, obese Mother: uterine cancer age 30 Siblings: sister has had lymph nodes removed, no cancer sister high cholesterol  Social History: Last updated: 13-Sep-2009 Never Smoked Marital Status:  Children: 2 Occupation: [employer] finished masters degree summer 2011 married Alcohol Use - yes  occ Illicit Drug Use - no is finishing masters degree   Risk Factors: Smoking Status: never (03/20/2007)  Review of Systems General:  Denies chills, fatigue, fever, loss of appetite, and  malaise. Eyes:  Denies blurring and eye irritation. CV:  Denies chest pain or discomfort, lightheadness, and palpitations. Resp:  Denies cough and shortness of breath. GI:  Denies nausea and vomiting. MS:  Denies joint pain. Derm:  Complains of itching and lesion(s); denies rash. Neuro:  Denies numbness. Heme:  Denies abnormal bruising and bleeding.  Physical Exam  General:  overweight but generally well appearing  Head:  normocephalic, atraumatic, and no abnormalities observed.  no sinus or temporal tenderness  Eyes:  vision grossly intact, pupils equal, pupils round, pupils reactive to light, and no injection.   Ears:  R ear normal and L ear normal.   Nose:  L tip of nose is erythematous with induration Mouth:  pharynx pink and moist, no erythema, and no exudates.   Neck:  supple with full rom and no masses or thyromegally, no JVD or carotid bruit  Chest Wall:  No deformities, masses, or tenderness noted. Lungs:  Normal respiratory effort, chest expands symmetrically. Lungs are clear to auscultation, no crackles or wheezes. Heart:  Normal rate and regular rhythm. S1 and S2 normal without gallop, murmur, click, rub or other extra sounds. Skin:  erythema and induration L tip of nose no lesions or vesicles  nares are dry without lesions or scabs  Cervical Nodes:  No lymphadenopathy noted Psych:  normal affect, talkative and pleasant  Impression & Recommendations:  Problem # 1:  RASH AND OTHER NONSPECIFIC SKIN ERUPTION (ICD-782.1) Assessment Deteriorated  recurrent skin infection on / in nose  ? as to whether poss mrsa imp with abx and then improved  will tx with bactrim DS and bactroban in nostrils disc hygiene  Orders: Prescription Created Electronically [phone])  Complete Medication List: 1)  Bactrim Ds 800-160 Mg Tabs (Sulfamethoxazole-trimethoprim) .Marland Kitchen.. 1 by mouth two times a day for 7 days 2)  Bactroban 2 % Oint (Mupirocin) .... Apply to inside of both nostrils and  red area on nose two times a day for 10 days  Patient Instructions: 1)  take the bactrim as directed  2)  use the bactroban ointment as directed  3)  in not improved in 7 days or if worse please let me know  Prescriptions: BACTROBAN 2 % OINT (MUPIROCIN) apply to inside of both nostrils and red area on nose two times a day for 10 days  #1 small x 0   Entered and Authorized by:   Judith Part MD   Signed by:   Judith Part MD on 10/16/2009   Method used:   Electronically to        [facility]  [address] (retail)       [address]       [place], [place]  [address]       Ph: [phone] or [phone]       Fax: [phone]   RxID:   [id number] BACTRIM DS 800-160 MG TABS (SULFAMETHOXAZOLE-TRIMETHOPRIM) 1 by mouth two times a day for 7 days  #14 x 0   Entered and Authorized by:   Judith Part MD   Signed by:   Judith Part MD on 10/16/2009   Method used:   [facility]        [facility]  [address] (retail)       [address]       [place], [place]  [address]       Ph: [phone] or [phone]       Fax: [phone]   RxID:   [id number]   Prior Medications (reviewed today): None Current Allergies (reviewed today): No known allergies

## 2010-03-17 NOTE — Progress Notes (Signed)
Summary: Upcoming Procedure  Phone Note Call from Patient Call back at [phone]   Caller: Patient Call For: Dr. Leone Payor Reason for Call: Talk to Nurse Details for Reason: Upcoming Procedure Summary of Call: Pt. has upcoming EGD at [facility]. She needs to be given date, time, and instructions. Also requested, if possible, that it needed to be done before the end of November as new insurance would begin 12/1.  Please call and advise. Initial call taken by: Schuyler Amor,  January 01, 2010 8:48 AM  Follow-up for Phone Call        I have reviewed her EUS instructions with her over the phone.  She wanted her procedure moved up.  I have advised her this is not possible with Dr Christella Hartigan EUS schedule and the upcoming holiday.  Dr Leone Payor she had multiple questions about long term care of this cyst 1) Can it be surgically removed? 2) If not surgically removed, can she expect to have EUS with cyst drainage every few months? 3) Is there a way to stop the cyst from "filling back up"?  She is willing to have EUS on 01/15/10, but did want to get some questions answered prior.  Dr Leone Payor please advise. Follow-up by: Darcey Nora RN, CGRN,  January 01, 2010 9:13 AM  Additional Follow-up for Phone Call Additional follow up Details #1::        Dr. Shela Commons and I both think that one more aspiration and a follow-up as far as sxs with reimaging then (MR or CT) to see if its back (hopefully not) make more sense then sending her for surgery at this time. Hope that it goes away premanantly with aspiration. This is our plan now - she can see a surgeon if desired but we doubt they would operate at this time. I can call her personally if this is not adequate explanation Additional Follow-up by: Iva Boop MD, [facility],  January 01, 2010 12:36 PM    Additional Follow-up for Phone Call Additional follow up Details #2::    Left message for patient to call back Darcey Nora RN, [facility]  January 01, 2010 1:42 PM  Relayed Dr  Teresita Madura answer to the patient she will proceed with EUS on 01/15/10 Follow-up by: Darcey Nora RN, CGRN,  January 01, 2010 2:37 PM

## 2010-03-17 NOTE — Letter (Signed)
Summary: EGD Instructions  [facility]  [address] [place], [place] [address]   Phone: [phone]  Fax: [phone]       Nicole [name]    Apr 02, 1955    MRN: [id number]       Procedure Day /Date:01/15/10 THURS     Arrival Time: 700 am     Procedure Time:800 am     Location of Procedure:                     X [facility] ( Outpatient Registration)    PREPARATION FOR ENDOSCOPY   On 01/15/10 THE DAY OF THE PROCEDURE:  1.   No solid foods, milk or milk products are allowed after midnight the night before your procedure.  2.   Do not drink anything colored red or purple.  Avoid juices with pulp.  No orange juice.  3.  You may drink clear liquids until 4 am , which is 4 hours before your procedure.                                                                                                CLEAR LIQUIDS INCLUDE: Water Jello Ice Popsicles Tea (sugar ok, no milk/cream) Powdered fruit flavored drinks Coffee (sugar ok, no milk/cream) Gatorade Juice: apple, white grape, white cranberry  Lemonade Clear bullion, consomm, broth Carbonated beverages (any kind) Strained chicken noodle soup Hard Candy   MEDICATION INSTRUCTIONS  Unless otherwise instructed, you should take regular prescription medications with a small sip of water as early as possible the morning of your procedure.               OTHER INSTRUCTIONS  You will need a responsible adult at least 56 years of age to accompany you and drive you home.   This person must remain in the waiting room during your procedure.  Wear loose fitting clothing that is easily removed.  Leave jewelry and other valuables at home.  However, you may wish to bring a book to read or an iPod/MP3 player to listen to music as you wait for your procedure to start.  Remove all body piercing jewelry and leave at home.  Total time from sign-in until discharge is approximately 2-3 hours.  You should go  home directly after your procedure and rest.  You can resume normal activities the day after your procedure.  The day of your procedure you should not:   Drive   Make legal decisions   Operate machinery   Drink alcohol   Return to work  You will receive specific instructions about eating, activities and medications before you leave.    The above instructions have been reviewed and explained to me by   Chales Abrahams CMA Duncan Dull)  December 31, 2009 11:20 AM     I fully understand and can verbalize these instructions over the phone and mailed to home Date 12/31/09

## 2010-03-17 NOTE — Miscellaneous (Signed)
Summary: rx  Clinical Lists Changes  Medications: Added new medication of CIPROFLOXACIN HCL 500 MG  TABS (CIPROFLOXACIN HCL) Take 1 twice a day for 3 days - Signed Rx of CIPROFLOXACIN HCL 500 MG  TABS (CIPROFLOXACIN HCL) Take 1 twice a day for 3 days;  #6 x 0;  Signed;  Entered by: Rachael Fee MD;  Authorized by: Rachael Fee MD;  Method used: Print then Give to Patient    Prescriptions: CIPROFLOXACIN HCL 500 MG  TABS (CIPROFLOXACIN HCL) Take 1 twice a day for 3 days  #6 x 0   Entered and Authorized by:   Rachael Fee MD   Signed by:   Rachael Fee MD on 01/15/2010   Method used:   Print then Give to Patient   RxID:   [id number]

## 2010-03-17 NOTE — Consult Note (Signed)
Summary: [facility]  [facility]   Imported By: Lanelle Bal 03/31/2009 08:05:27  _____________________________________________________________________  External Attachment:    Type:   Image     Comment:   External Document

## 2010-03-17 NOTE — Progress Notes (Signed)
Summary: triage  Phone Note Call from Patient Call back at [phone]  (cell)   Caller: Patient Call For: Dr Leone Payor Reason for Call: Talk to Nurse Summary of Call: pt states that she was told by Dr. Leone Payor  to call the office if her right side abd pain returned to get her sch'ed asap Initial call taken by: Vallarie Mare,  December 05, 2009 12:02 PM  Follow-up for Phone Call        she needs phone triage as to what sxs are going on in past she had hematuria with right flank pain please find out what is happening Follow-up by: Iva Boop MD, [facility] Graham,  December 05, 2009 4:17 PM  Additional Follow-up for Phone Call Additional follow up Details #1::        Patient  c/o "little bit of a bulge in my right upper side".  She states that had a cyst drained in January and pain is starting to return.  She states it is more uncomfortable than painful.  She discribes it "like a runners cramp".  She will come in on Monday and see Dr Leone Payor at 9:45 Additional Follow-up by: Darcey Nora RN, CGRN,  December 05, 2009 4:30 PM

## 2010-03-17 NOTE — Assessment & Plan Note (Signed)
Summary: 3 months follow up /lsf R/S FROM 07/22/09   Vital Signs:  Patient profile:   55 year old female Height:      64.25 inches Weight:      215.75 pounds BMI:     36.88 Temp:     97.7 degrees F oral Pulse rate:   68 / minute Pulse rhythm:   regular BP sitting:   120 / 82  (left arm) Cuff size:   large  Vitals Entered By: Lewanda Rife LPN (Sep 20, 2009 8:25 AM) CC: three month f/u   History of Present Illness: here for f/u of high choleterol  is doing well   she had colonosc  had endoscopic [place] and took out cyst -- from GI system -- and had it removed  then after a month the pain in her side got better  no more dietary problems    this check trig 86 and HDL 44 and LDL 178 (that is up from 164) diet used to be really bad - and she could not eat a lot of foods with her prev GI problems  could not eat fruit and veg for a while - and this is better now   was eating bland foods and breads   she has started cutting back on red meat (used to eat at least once per week)   now -- eats red meat once per week  no more fast food  not a lot of eggs -- 1 per week  fried foods at once per week  no shrimp or shellfish  too much cheese - eats that frequently- almost every day  eats a lot of yogurt -- with low cholesterol  watches label for cholesterol   is getting back to exercise -- since skin problems were so severe -- and now is happy to be better  doing yardwork  no regular exercise program  has equiptment at home - does not use it - will be joining a gym     does have coronary artery disease in family     Allergies (verified): No Known Drug Allergies  Past History:  Past Medical History: Last updated: 04/15/2009 Hyperlipidemia Hypertension 17 years ago most likely allergic to dogs with skin condition     derm Danella Deis   Family History: Last updated: 20-Sep-2009 Father: deceased- has MI x 2, lymphatic cancer, obese Mother: uterine cancer age 75 Siblings:  sister has had lymph nodes removed, no cancer sister high cholesterol  Social History: Last updated: 09-20-2009 Never Smoked Marital Status:  Children: 2 Occupation: [employer] finished masters degree summer 2011 married Alcohol Use - yes  occ Illicit 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   Risk Factors: Smoking Status: never (03/20/2007)  Past Surgical History: Caesarean section x2 Pre- eclampsia (1995) Miscarriage (1988) Tubal ligation Pelvic US- 11 mm uterine stripe (02/2004) Ovarian cyst 3/11- GI cyst removed   Family History: Father: deceased- has MI x 2, lymphatic cancer, obese Mother: uterine cancer age 71 Siblings: sister has had lymph nodes removed, no cancer sister high cholesterol  Social History: Never Smoked Marital Status:  Children: 2 Occupation: [employer] finished masters degree summer 2011 married Alcohol Use - yes  occ Illicit 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 Eyes:  Denies blurring and eye irritation. CV:  Denies lightheadness, palpitations, and shortness of breath with exertion. Resp:  Denies cough,  pleuritic, shortness of breath, and wheezing. GI:  Denies abdominal pain and change in bowel habits. MS:  Denies muscle aches, cramps, and muscle weakness. Derm:  Denies itching, lesion(s), poor wound healing, and rash. Neuro:  Denies numbness and tingling. Psych:  Denies alternate hallucination ( auditory/visual), anxiety, and depression. Endo:  Denies cold intolerance and heat intolerance. Heme:  Denies abnormal bruising and bleeding.  Physical Exam  General:  overweight but generally well appearing  Head:  normocephalic, atraumatic, and no abnormalities observed.   Mouth:  pharynx pink and moist.   Neck:  supple with full rom and no masses or thyromegally, no JVD or carotid bruit  Lungs:  Normal respiratory effort, chest expands  symmetrically. Lungs are clear to auscultation, no crackles or wheezes. Heart:  Normal rate and regular rhythm. S1 and S2 normal without gallop, murmur, click, rub or other extra sounds. Pulses:  R and L carotid,radial,femoral,dorsalis pedis and posterior tibial pulses are full and equal bilaterally Extremities:  No clubbing, cyanosis, edema, or deformity noted with normal full range of motion of all joints.   Skin:  Intact without suspicious lesions or rashes Cervical Nodes:  No lymphadenopathy noted Psych:  normal affect, talkative and pleasant    Impression & Recommendations:  Problem # 1:  HYPERLIPIDEMIA (ICD-272.4) Assessment Deteriorated  long disc about sat fats in diet pt is strongly resistant to med after her sister had bad statin rxn rev CV risk factors rev goals for LDL  will work hard on lifestyle change and re check in 3 mo   Labs Reviewed: SGOT: 19 (08/14/2009)   SGPT: 19 (08/14/2009)   HDL:44.20 (08/14/2009), 54.10 (04/15/2009)  Chol:237 (08/14/2009), 221 (04/15/2009)  Trig:86.0 (08/14/2009), 86.0 (04/15/2009)  Patient Instructions: 1)  you can raise your HDL (good cholesterol) by increasing exercise and eating omega 3 fatty acid supplement like fish oil or flax seed oil over the counter 2)  you can lower LDL (bad cholesterol) by limiting saturated fats in diet like red meat, fried foods, egg yolks, fatty breakfast meats, high fat dairy products and shellfish 3)  please schedule fasting lipid in 3 months with ast /alt 272  Prior Medications (reviewed today): None Current Allergies (reviewed today): No known allergies

## 2010-03-17 NOTE — Letter (Signed)
Summary: EGD Instructions  [facility]  [address] [place], [place] [address]   Phone: [phone]  Fax: [phone]       Nicole [name]    02-25-1955    MRN: [id number]       Procedure Day Dorna Bloom: Lenor Coffin, 05/01/09     Arrival Time:  9:00 AM     Procedure Time: 10:15 AM     Location of Procedure:                    _X_ [facility] ( Outpatient Registration)    PREPARATION FOR ENDOSCOPY   On THURSDAY, 05/01/09 THE DAY OF THE PROCEDURE:  1.   No solid foods, milk or milk products are allowed after midnight the night before your procedure.  2.   Do not drink anything colored red or purple.  Avoid juices with pulp.  No orange juice.  3.  You may drink clear liquids until 6:15 AM, which is 4 hours before your procedure.                                                                                                CLEAR LIQUIDS INCLUDE: Water Jello Ice Popsicles Tea (sugar ok, no milk/cream) Powdered fruit flavored drinks Coffee (sugar ok, no milk/cream) Gatorade Juice: apple, white grape, white cranberry  Lemonade Clear bullion, consomm, broth Carbonated beverages (any kind) Strained chicken noodle soup Hard Candy   MEDICATION INSTRUCTIONS  Unless otherwise instructed, you should take regular prescription medications with a small sip of water as early as possible the morning of your procedure.                 OTHER INSTRUCTIONS  You will need a responsible adult at least 56 years of age to accompany you and drive you home.   This person must remain in the waiting room during your procedure.  Wear loose fitting clothing that is easily removed.  Leave jewelry and other valuables at home.  However, you may wish to bring a book to read or an iPod/MP3 player to listen to music as you wait for your procedure to start.  Remove all body piercing jewelry and leave at home.  Total time from sign-in until discharge is approximately  2-3 hours.  You should go home directly after your procedure and rest.  You can resume normal activities the day after your procedure.  The day of your procedure you should not:   Drive   Make legal decisions   Operate machinery   Drink alcohol   Return to work  You will receive specific instructions about eating, activities and medications before you leave.    The above instructions have been reviewed and explained to me by   _______________________    I fully understand and can verbalize these instructions _____________________________ Date _________

## 2010-03-17 NOTE — Letter (Signed)
Summary: [facility]   Imported By: Sherian Rein 05/13/2009 12:19:01  _____________________________________________________________________  External Attachment:    Type:   Image     Comment:   External Document

## 2010-03-17 NOTE — Assessment & Plan Note (Signed)
Summary: Follow up (urine culture) Nicole [name]   Vital Signs:  Patient profile:   55 year old female Height:      64.25 inches Weight:      218.25 pounds BMI:     37.31 Temp:     97.9 degrees F oral Pulse rate:   96 / minute Pulse rhythm:   regular BP sitting:   130 / 76  (left arm) Cuff size:   large  Vitals Entered By: Delilah Shan CMA Duncan Dull) (March 13, 2009 2:39 PM) CC: Follow up (urine culture)   History of Present Illness: 55 yo here for follow up right suprapubic aching that radiates to groin x 3 weeks.  Right suprapubic aching. Moved to right back and now has increased urinary frequency and mild dysuria. No fevers or chills. No n/v/d.  No h/o kidney stones, never had a UTI. Feels like symptoms are getting progressively worse.  Saw her a few days ago, UA showed microscopic hematuria. Started cipro for presumed UTI, sent urine culture which showed no growth.  Today, repeat UA showed microscopic hematuria and again otherwise normal. Still has same suprapubic pain.  Non smoker, works for [employer].    Current Medications (verified): 1)  Ibuprofen 200 Mg Tabs (Ibuprofen) .... Otc As Directed. 2)  Cipro 500 Mg Tabs (Ciprofloxacin Hcl) .Marland Kitchen.. 1 By Mouth 2 Times Daily X 7 Days  Allergies (verified): No Known Drug Allergies  Review of Systems      See HPI General:  Denies chills, fever, and malaise. GI:  Denies diarrhea, nausea, and vomiting. GU:  Complains of hematuria; denies dysuria, incontinence, urinary frequency, and urinary hesitancy.  Physical Exam  General:  Well-developed,well-nourished,in no acute distress; alert,appropriate and cooperative throughout examination Mouth:  MMM Abdomen:  mild right suprapubic tenderness Neg  right CVA tenderness Psych:  normal affect, talkative and pleasant    Impression & Recommendations:  Problem # 1:  MICROSCOPIC HEMATURIA (ICD-599.72) Assessment Unchanged Time spent with patient 25 minutes, more than 50%  of this time was spent discussing different causes of microscopic hematuria.  Advised to finish course of abx as I do not want her stopping and starting them and she felt some relief while taking it. Will refer to urology for further w/u - nephrolithiases vs IC.  Her updated medication list for this problem includes:    Cipro 500 Mg Tabs (Ciprofloxacin hcl) .Marland Kitchen... 1 by mouth 2 times daily x 7 days  Orders: Urology Referral (Urology)  Complete Medication List: 1)  Ibuprofen 200 Mg Tabs (Ibuprofen) .... Otc as directed. 2)  Cipro 500 Mg Tabs (Ciprofloxacin hcl) .Marland Kitchen.. 1 by mouth 2 times daily x 7 days  Patient Instructions: 1)  Please stop by to see Shirlee Limerick on your way out to set up your referral.  Current Allergies (reviewed today): No known allergies   Laboratory Results   Urine Tests   Date/Time Reported: March 13, 2009 2:47 PM   Routine Urinalysis   Color: yellow Appearance: Hazy Glucose: negative   (Normal Range: Negative) Bilirubin: negative   (Normal Range: Negative) Ketone: negative   (Normal Range: Negative) Spec. Gravity: >=1.030   (Normal Range: 1.003-1.035) Blood: trace-intact   (Normal Range: Negative) pH: 6.0   (Normal Range: 5.0-8.0) Protein: trace   (Normal Range: Negative) Urobilinogen: 0.2   (Normal Range: 0-1) Nitrite: negative   (Normal Range: Negative) Leukocyte Esterace: negative   (Normal Range: Negative)

## 2010-03-17 NOTE — Procedures (Signed)
Summary: Prep/[facility]  Prep/[facility]   Imported By: Lester Sans Souci 04/09/2009 10:48:55  _____________________________________________________________________  External Attachment:    Type:   Image     Comment:   External Document

## 2010-03-17 NOTE — Letter (Signed)
Summary: Results Follow up Letter  [facility]  [address] [place], [place] [address]   Phone: [phone]  Fax: [phone]    04/22/2009 MRN: [id number]    [facility] [address] [place], [place]  [address]    Dear Ms. Vandagriff,  The following are the results of your recent test(s):  Test         Result    Pap Smear:        Normal __X___  Not Normal _____ Comments: ______________________________________________________ Cholesterol: LDL(Bad cholesterol):         Your goal is less than:         HDL (Good cholesterol):       Your goal is more than: Comments:  ______________________________________________________ Mammogram:        Normal _____  Not Normal _____ Comments:  ___________________________________________________________________ Hemoccult:        Normal _____  Not normal _______ Comments:    _____________________________________________________________________ Other Tests:    We routinely do not discuss normal results over the telephone.  If you desire a copy of the results, or you have any questions about this information we can discuss them at your next office visit.   Sincerely,    Idamae Schuller Kili Gracy,MD  MT/ri

## 2010-03-17 NOTE — Progress Notes (Signed)
Summary: needs GI referral  Phone Note Call from Patient Call back at [phone]   Caller: Patient Call For: Judith Part MD Summary of Call: Pt saw her urologist on friday and had a CT done.  They are suggesting that she see a GI.  They are supposed to be sending their note from friday, I told pt you  would need that before you refer her.  She said if her copy is in her mail box she will bring it in.              Lowella Petties CMA  March 31, 2009 12:58 PM   Follow-up for Phone Call        Pt has brought in CT results from urologist.  She is asking that you review this and have someone call her back to explain results.  She is also asking for a GI referral.  Note is on your shelf.  the urologist can explain her CT results better than I can , but in a nutshell ... urinary tract looks normal (no kidney stones), there are some cysts of unknown cause on liver and pancreas -- unsure if harmless and incidental or if they could be rel to her pain that I think is why her urologist wants to ref her to GI I will go ahead and do ref to GI and ref to Shirlee Limerick  will mark CT to scan (please scan here so in chart now- thanks)  Follow-up by: Lowella Petties CMA,  April 02, 2009 2:25 PM  Additional Follow-up for Phone Call Additional follow up Details #1::        Patient notified as instructed by telephone. Pt will wait to hear back from [facility]. Pt would like a referral to Dr Leone Payor. Shirlee Limerick pt said to call her on her cell [phone]. thank you. Lewanda Rife LPN  April 03, 2009 9:36 AM   New Problems: FLANK PAIN (ICD-789.09)   Additional Follow-up for Phone Call Additional follow up Details #2::    Appt made with Dr Leone Payor on 04/07/2009. Follow-up by: Carlton Adam,  April 03, 2009 3:32 PM  New Problems: FLANK PAIN (ICD-789.09)

## 2010-03-17 NOTE — Progress Notes (Signed)
Summary: Colon  Phone Note Call from Patient Call back at Home Phone [phone]   Summary of Call: called pt to give her the Path results and she was asking if she should have a Colon.  I explained I would leave a message for Dr Leone Payor and someone would call her about scheduling. Initial call taken by: Chales Abrahams CMA Duncan Dull),  April 30, 2009 8:34 AM  Follow-up for Phone Call        Per your last office note you wanted her to have EUS prior to colon.  Please advise if appropriate to set up for direct colon? Follow-up by: Darcey Nora RN, CGRN,  April 30, 2009 2:25 PM  Additional Follow-up for Phone Call Additional follow up Details #1::        yes, it is time for screening colonoscopy Additional Follow-up by: Iva Boop MD, Clementeen Graham,  May 01, 2009 11:09 AM     Appended Document: Colon I have left her a message asking her to call back and schedule a screening colonoscopy at her convenience  Appended Document: Colon screening colon scheduled for 06/04/09.

## 2010-03-17 NOTE — Procedures (Signed)
Summary: Endoscopic Ultrasound  Patient: Nicole [name] Note: All result statuses are Final unless otherwise noted.  Tests: (1) Endoscopic Ultrasound (EUS)  EUS Endoscopic Ultrasound                             DONE     [facility]     [address] [place], [place]  [address]           ENDOSCOPIC ULTRASOUND PROCEDURE REPORT           PATIENT:  [name], [name]  MR#:  [id number]     BIRTHDATE:  Aug 18, 1955  GENDER:  female     ENDOSCOPIST:  Rachael Fee, MD     REFERRED BY:  Iva Boop, M.D., [facility]     PROCEDURE DATE:  01/15/2010     PROCEDURE:  Upper EUS w/FNA     ASA CLASS:  Class II     INDICATIONS:  peripancreatic cyst, initially noted and aspirated     04/2009 with upper abd pain symptom relief (CEA very low, amylase     elevated, cytology neg).  Symptoms recurred, MRI shows cyst     recurred.     MEDICATIONS:  Fentanyl 75 mcg IV, Versed 8 mg IV, cipro 400           DESCRIPTION OF PROCEDURE:   After the risks benefits and     alternatives of the procedure were  explained, informed consent     was obtained. The patient was then placed in the left, lateral,     decubitus postion and IV sedation was administered. Throughout the     procedure, the patient's blood pressure, pulse and oxygen     saturations were monitored continuously. Under direct     visualization, the Pentax Linear [id number] endoscope was introduced     through the mouth  and advanced to the duodenum.  Water was used     as necessary to provide an acoustic interface.  Upon completion of     the imaging, water was removed and the patient was sent to the     recovery room in satisfactory condition.     <<PROCEDUREIMAGES>>           Endoscopic findings (limited views with linear echoendoscope):     1. Normal esophagus, stomach, proximal duodenum           EUS findings (targeted exam for cyst aspiration):     1. Anechoic 3.5cm by 1.8cm cyst that directly abuts posterior wall     of  stomach. There were no internal septations or associated solid     masses. The cyst does not clearly involve the pancreas, liver or     stomach.  The cyst was completely aspirated with a single pass     with a 19 guage BS EUS FNA needle under color Doppler guidance.     5cc of clear, yellowish, thin fluid was aspiratated and all of it     was sent to cytology.           Impression:     3.5cm by 1.8cm cyst that lays near pancreas, liver, stomach.     Unclear where it originates on this exam or on previous CT/MRI     images.  The cyst fluid was completely aspirated and we will     monitor her for symptom resolution.  She will complete 3  days of     cipro.           ______________________________     Rachael Fee, MD           n.     eSIGNED:   Rachael Fee at 01/15/2010 08:26 AM           Laure Kidney, [id number]  Note: An exclamation mark (!) indicates a result that was not dispersed into the flowsheet. Document Creation Date: 01/15/2010 8:26 AM _______________________________________________________________________  (1) Order result status: Final Collection or observation date-time: 01/15/2010 08:12 Requested date-time:  Receipt date-time:  Reported date-time:  Referring Physician:   Ordering Physician: Rob Bunting [phone]) Specimen Source:  Source: Launa Grill Order Number: [id number] Lab site:

## 2010-03-17 NOTE — Assessment & Plan Note (Signed)
Summary: nose is painful after a week and 1/2/dlo   Vital Signs:  Patient profile:   55 year old female Height:      64.25 inches Weight:      210.25 pounds BMI:     35.94 Temp:     97.9 degrees F oral Pulse rate:   72 / minute Pulse rhythm:   regular BP sitting:   110 / 80  (right arm) Cuff size:   regular  Vitals Entered By: Linde Gillis CMA Duncan Dull) (September 24, 2009 10:36 AM) CC: nose pain   History of Present Illness: 55 yo here for painful, red nose- hurts inside her nostrils but also very tender on outside. Feels like a sunburn but did not get a sunburn.  Started shortly after she had oral surgery 2 weeks ago- required a lot of novocaine to numb the area.  Pain feels like a burning, itching tingling pain. Helped by Advil.  Never had anything like this before.  No runny nose or sinus symtpoms. No fevers or chills.  Current Medications (verified): 1)  Doxycycline Hyclate 100 Mg Caps (Doxycycline Hyclate) .... Take 1 Tab Twice A Day X 10 Days  Allergies (verified): No Known Drug Allergies  Past History:  Past Medical History: Last updated: 04/15/2009 Hyperlipidemia Hypertension 17 years ago most likely allergic to dogs with skin condition     derm Danella Deis   Past Surgical History: Last updated: Aug 30, 2009 Caesarean section x2 Pre- eclampsia (1995) Miscarriage (1988) Tubal ligation Pelvic US- 11 mm uterine stripe (02/2004) Ovarian cyst 3/11- GI cyst removed   Family History: Last updated: 2009-08-30 Father: deceased- has MI x 2, lymphatic cancer, obese Mother: uterine cancer age 56 Siblings: sister has had lymph nodes removed, no cancer sister high cholesterol  Social History: Last updated: 30-Aug-2009 Never Smoked Marital Status:  Children: 2 Occupation: [employer] finished masters degree summer 2011 married Alcohol Use - yes  occ Illicit 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 ENT:  Denies nasal congestion, postnasal drainage, sinus pressure, and sore throat. GI:  Denies abdominal pain, nausea, and vomiting.  Physical Exam  General:  overweight but generally well appearing  Nose:  no nasal discharge, mild erythema in left nostril- small sore in left nostril Top of nose- erythematous, painful to touch, no lesions.   Psych:  normal affect, talkative and pleasant    Impression & Recommendations:  Problem # 1:  RASH AND OTHER NONSPECIFIC SKIN ERUPTION (ICD-782.1) Assessment New ?possible shingles with bacterial superinfection. OUtside of window for antiviral treatment, will treat for bacterial infeciton with doxy. Pt in agreement with plan.  Complete Medication List: 1)  Doxycycline Hyclate 100 Mg Caps (Doxycycline hyclate) .... Take 1 tab twice a day x 10 days Prescriptions: DOXYCYCLINE HYCLATE 100 MG CAPS (DOXYCYCLINE HYCLATE) Take 1 tab twice a day x 10 days  #20 x 0   Entered and Authorized by:   Ruthe Mannan MD   Signed by:   Ruthe Mannan MD on 09/24/2009   Method used:   [facility]  [address]       [address]       [place], [place]  [address]       Ph: [phone] or [phone]       Fax: [phone]   RxID:  [id number]   Prior Medications (reviewed today): None Current Allergies (reviewed today): No known allergies

## 2010-03-17 NOTE — Progress Notes (Signed)
Summary: Sooner Appt.  Phone Note From Other Clinic   Caller: Patient Caller: Nicole [name] [facility] [phone] Call For: Dr. Leone Payor Summary of Call: Pt has a cyst on her liver and Pancreas. Requesting Dr. Leone Payor. Has appt. on 05-12-09 and needs a sooner appt. Initial call taken by: Karna Christmas,  April 03, 2009 11:37 AM  Follow-up for Phone Call        I spoke with the pt and moved her to Monday, 2/21 as we had a cancellation.  Message left for [name] that pt has been moved. Follow-up by: Francee Piccolo CMA Duncan Dull),  April 03, 2009 1:58 PM

## 2010-03-17 NOTE — Letter (Signed)
Summary: Results Letter  [facility]  [address] [place], [place] [address]   Phone: [phone]  Fax: [phone]        May 08, 2009 MRN: [id number]    [facility] [address] [place], [place]  [address]    Dear Ms. Colville,  We recieved faxed notes from 1993 medical care in [place]; none of these records noted a cystic lesion in abd (none of the records were imaging reports).  Thanks for getting the records, but I don't think they shed any light on your current cyst.  The cytology from that cyst and also CEA, amylase levels were not at all concerning.  You should be followed clinically only.  Please call if you have any questions or concerns.      Sincerely,  Chales Abrahams CMA (AAMA)  This letter has been electronically signed by your physician.

## 2010-03-17 NOTE — Progress Notes (Signed)
Summary: speak to nurse  Phone Note Call from Patient Call back at [phone]   Caller: Patient Call For: Leone Payor Reason for Call: Talk to Nurse Summary of Call: Patient want to speak to nurse regarding pain on her right side and wanting to know if she needs to see him in the office before her procedure in April. Initial call taken by: Tawni Levy,  May 09, 2009 9:41 AM  Follow-up for Phone Call        Patient  with continued nagging pain that was mostly flank pain now is more RUQ.  Patient  states she feels  a noticeable lump on her right side but only when standing.  She doesn't feel this when lying or sitting.  She says exercise makes this pain worse.  She has a colon scheduled for 06-04-09.  She is wondering if she should be seen by Dr Leone Payor prior to colon to further work up this pain.  I spoke with Dr Christella Hartigan, he feels the cyst is not the cause of the pain.  Patient  aware that Dr Leone Payor out of the office today and we will call her next week with Dr Marvell Fuller recommendations Follow-up by: Darcey Nora RN, CGRN,  May 09, 2009 10:26 AM  Additional Follow-up for Phone Call Additional follow up Details #1::        she needs to be re-eaxamined REV me or exender rethe pain and bulge Iva Boop MD, [facility]  May 12, 2009 8:18 AM     Additional Follow-up for Phone Call Additional follow up Details #2::     Pt. says pain went completely away this week end and she is feeling fine. Is out of town till Thursday.Will call back if it re-occurs. Follow-up by: Teryl Lucy RN,  May 12, 2009 8:27 AM  Additional Follow-up for Phone Call Additional follow up Details #3:: Details for Additional Follow-up Action Taken: ok Iva Boop MD, [facility]  May 12, 2009 1:18 PM

## 2010-03-17 NOTE — Miscellaneous (Signed)
Summary: [facility] Previsit/prep  Clinical Lists Changes  Medications: Added new medication of MOVIPREP 100 GM  SOLR (PEG-KCL-NACL-NASULF-NA ASC-C) As per prep instructions. - Signed Rx of MOVIPREP 100 GM  SOLR (PEG-KCL-NACL-NASULF-NA ASC-C) As per prep instructions.;  #1 x 0;  Signed;  Entered by: Wyona Almas RN;  Authorized by: Iva Boop MD, FACG;  Method used: [facility]  [place], [place], [place]  [address], Ph: [phone] or [phone], Fax: [phone] Observations: Added new observation of NKA: T (05/30/2009 14:00)    Prescriptions: MOVIPREP 100 GM  SOLR (PEG-KCL-NACL-NASULF-NA ASC-C) As per prep instructions.  #1 x 0   Entered by:   Wyona Almas RN   Authorized by:   Iva Boop MD, [facility]   Signed by:   Wyona Almas RN on 05/30/2009   Method used:   [facility]        [facility]  [place] (retail)       [address]       [place], [place]  [address]       Ph: [phone] or [phone]       Fax: [phone]   RxID:   [id number]

## 2010-03-17 NOTE — Progress Notes (Signed)
Summary: schedule MR abdomen  Phone Note Outgoing Call Call back at [phone]   Summary of Call: Tell herafter conferring with radiology I recommend MR abomen without and with IV contrast to reasess peripancreatic cyst and evaluate RUQ pain. Let them know she had 03/28/2009 CT at [facility] to compare to. Iva Boop MD, [facility]  December 11, 2009 8:24 AM   Follow-up for Phone Call        pt agreeable with plan.  no metal, claustphobia, or contrast allergy.  Per Victorino Dike pt is scheudled for 11/10 @ 9 am.   Pt states we have the disk of her CT at [facility].  Dr. Leone Payor, do you still have this disk? Follow-up by: Francee Piccolo CMA Duncan Dull),  December 12, 2009 4:14 PM  Additional Follow-up for Phone Call Additional follow up Details #1::        sorry - I do not always keep those she can get another copy if she wants [facility] reads those and can see the images on PACS so they do not need a disc Additional Follow-up by: Iva Boop MD, Clementeen Graham,  December 16, 2009 5:27 AM

## 2010-03-17 NOTE — Op Note (Signed)
Summary: [facility]   Imported By: Sherian Rein 05/13/2009 12:21:32  _____________________________________________________________________  External Attachment:    Type:   Image     Comment:   External Document

## 2010-03-17 NOTE — Assessment & Plan Note (Signed)
Summary: CPX AND PAP/DLO   Vital Signs:  Patient profile:   55 year old female Height:      64.25 inches Weight:      218.50 pounds BMI:     37.35 Temp:     97.9 degrees F oral Pulse rate:   88 / minute Pulse rhythm:   regular BP sitting:   114 / 74  (left arm) Cuff size:   large  Vitals Entered By: Lewanda Rife LPN (April 15, 1608 9:26 AM)  History of Present Illness: here for health mt exam and gyn care   is feeling good overall   has endo ultrasound next week to work up some side pain -- Dr Leone Payor (getting ready for that)  is in or near pancreas or liver  ? if this is chronic - may have had years ago - unsure  nl urol w/u   wt is up 3 lb  diet has been a little better than it was , is staying away from fatty foods and fast foods  is getting more exercise in the yard   bp good   due for lipid check-- and wellness labs   hx of ov cyst in past due for pap - 4 years - is due for that   (? had one with Dr Rollen Sox more recently)  hx of high hcg with nl workup - had [place] and all was normal  no hx of abn paps  due for mam was 09-- did not have one last year  self exam - no lumps or problems   colon screen- will disc with Dr Leone Payor when other workup is done   Td99 - is due for that   Allergies (verified): No Known Drug Allergies  Past History:  Past Surgical History: Last updated: 08/08/07 Caesarean section x2 Pre- eclampsia (1995) Miscarriage (1988) Tubal ligation Pelvic US- 11 mm uterine stripe (02/2004) Ovarian cyst  Family History: Last updated: 2007-08-08 Father: deceased- has MI x 2, lymphatic cancer, obese Mother: uterine cancer age 22 Siblings: sister has had lymph nodes removed, no cancer  Social History: Last updated: 04/15/2009 Never Smoked Marital Status:  Children: 2 Occupation: [employer] married Alcohol Use - yes  occ Illicit 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 Eyes:  Denies blurring and eye pain. CV:  Denies chest pain or discomfort and palpitations. Resp:  Denies cough, shortness of breath, and wheezing. GI:  Denies abdominal pain, bloody stools, change in bowel habits, and indigestion. GU:  Denies abnormal vaginal bleeding, discharge, and dysuria. MS:  Denies joint pain, joint redness, and joint swelling. Derm:  Denies itching, lesion(s), poor wound healing, and rash. Neuro:  Denies numbness and tingling. Psych:  Denies anxiety and depression. Endo:  Denies cold intolerance, excessive thirst, excessive urination, and heat intolerance. Heme:  Denies abnormal bruising and bleeding.  Physical Exam  General:  overweight but generally well appearing  Head:  normocephalic, atraumatic, and no abnormalities observed.   Eyes:  vision grossly intact, pupils equal, pupils round,  and pupils reactive to light.  no conjunctival pallor, injection or icterus  Ears:  R ear normal and L ear normal.   Nose:  no nasal discharge.   Mouth:  pharynx pink and moist.   Neck:  supple with full rom and no masses or thyromegally, no JVD or carotid bruit  Chest Wall:  No deformities, masses, or tenderness noted. Breasts:  No mass, nodules, thickening, tenderness, bulging, retraction, inflamation, nipple discharge or skin changes noted.   Lungs:  Normal respiratory effort, chest expands symmetrically. Lungs are clear to auscultation, no crackles or wheezes. Heart:  Normal rate and regular rhythm. S1 and S2 normal without gallop, murmur, click, rub or other  extra sounds. Abdomen:  Bowel sounds positive,abdomen soft and non-tender without masses, organomegaly or hernias noted. Genitalia:  Normal introitus for age, no external lesions, no vaginal discharge, mucosa pink and moist, no vaginal or cervical lesions, no vaginal atrophy, no friaility or hemorrhage, normal uterus size and position, no adnexal masses or tenderness Msk:  No deformity or scoliosis noted of thoracic or lumbar spine.  no acute joint changes  Pulses:  R and L carotid,radial,femoral,dorsalis pedis and posterior tibial pulses are full and equal bilaterally Extremities:  No clubbing, cyanosis, edema, or deformity noted with normal full range of motion of all joints.   Neurologic:  sensation intact to light touch, gait normal, and DTRs symmetrical and normal.   Skin:  Intact without suspicious lesions or rashes some lentigos some dry skin on hands Cervical Nodes:  No lymphadenopathy noted Axillary Nodes:  No palpable lymphadenopathy Inguinal Nodes:  No significant adenopathy Psych:  normal affect, talkative and pleasant    Impression & Recommendations:  Problem # 1:  HEALTH MAINTENANCE EXAM (ICD-V70.0) Assessment Comment Only reviewed health habits including diet, exercise and skin cancer prevention reviewed health maintenance list and family history disc the imp of wt loss wellness labs today Orders: Venipuncture (16109) TLB-Lipid Panel (80061-LIPID) TLB-BMP (Basic Metabolic Panel-BMET) (80048-METABOL) TLB-CBC Platelet - w/Differential (85025-CBCD) TLB-Hepatic/Liver Function Pnl (80076-HEPATIC) TLB-TSH (Thyroid Stimulating Hormone) (84443-TSH)  Problem # 2:  ROUTINE GYNECOLOGICAL EXAMINATION (ICD-V72.31) Assessment: Comment Only annual exam //pap  pt wants to sched own mam and knows she is overdue  no acute changes on exam   Problem # 3:  HYPERLIPIDEMIA (ICD-272.4) Assessment: Unchanged check today with slightly better diet rev low sat fat diet   Orders: Venipuncture (60454) TLB-Lipid Panel (80061-LIPID) TLB-BMP (Basic Metabolic Panel-BMET) (80048-METABOL) TLB-CBC Platelet - w/Differential (85025-CBCD) TLB-Hepatic/Liver Function Pnl (80076-HEPATIC) TLB-TSH (Thyroid Stimulating Hormone) (84443-TSH)  Problem # 4:  SCREENING, COLON CANCER (ICD-V76.51) Assessment: Comment Only pt will disc this with Dr Leone Payor at next visit -has never had colonosc   Problem # 5:  NONSPECIFIC ABN FINDING RAD & OTH EXAM GI TRACT (ICD-793.4) Assessment: Comment Only ? pancreatic cyst or mass- for endo [place] next week with GI  may need surgery depending- pt aware no symptoms presently  Problem # 6:  ECZEMA, HANDS (ICD-692.9) Assessment: Improved this is imp without dog in house now- ? if was an allergy  Complete Medication List: 1)  Ibuprofen 200 Mg Tabs (Ibuprofen) .... Otc as directed.  Other Orders: TD Toxoids IM 7 YR + (09811) Admin 1st Vaccine (91478)  Patient Instructions: 1)  you are due for mammogram - so schedule when able  2)  tetnus shot today  3)  keep working on healthy diet and exercise  4)  labs today 5)  talk to Dr Leone Payor about plan for colon cancer screening  6)  the current recommendation for calcium intake is 1200-1500 mg daily with -1000 IU of vitamin D   Current Allergies (reviewed today): No known allergies     Immunizations Administered:  Tetanus Vaccine:    Vaccine Type: Td    Site: left deltoid    Mfr: Sanofi Pasteur    Dose: 0.5 ml    Route: IM    Given by: Lewanda Rife LPN    Exp. Date: 12/31/2010    Lot #: [id number]    VIS given: 01/03/07 version given April 15, 2009.

## 2010-03-19 NOTE — Progress Notes (Signed)
Summary: ? recurrent abd pain after cyst drainage  Phone Note Outgoing Call   Summary of Call: please call her to see if she is having abdominal pain at this time Iva Boop MD, [facility]  February 24, 2010 6:33 AM   Follow-up for Phone Call        Lmom for patient at home and on her work numbers to return our call. Graciella Freer RN  February 24, 2010 10:24 AM   patient states "so far so good"  Denies any abdominal pain or complaints.  "No complaints".  She is asked to call back if any problems. Follow-up by: Darcey Nora RN, CGRN,  February 24, 2010 10:30 AM

## 2010-06-30 NOTE — Assessment & Plan Note (Signed)
Nicole [name], Nicole [name] NO.:  [id number]   MEDICAL RECORD NO.:  [id number]          PATIENT TYPE:  POB   LOCATION:  [facility]         FACILITY:  [facility]   PHYSICIAN:  Elsie Lincoln, MD      DATE OF BIRTH:  1955-11-05   DATE OF SERVICE:  08/17/2007                                  CLINIC NOTE   REFERRING PHYSICIAN:  Dr. Milinda Antis.   HISTORY OF PRESENT ILLNESS:  The patient is a 55 year old G3, para 2-0-1-  2, whose last menstrual period was approximately two and a half years  ago, and she is in menopause.  She recently presented to her  dermatologist for treatment of new onset of psoriasis.  They are putting  her on medication that contraindicated pregnancy and did a serum hCG.  It was elevated and she was sent to her primary care doctor.  This was  repeated and the beta-hCG level was 6, which was just slightly above  normal.  The patient has never had any postmenopausal bleeding or  spotting, but since knowing that her hCG is negative, she has urinary  frequency, bloating, and she thinks it is just psychological given that  she is worried about this test result.  Her mother has had a history of  endometrial cancer in her 30s; however, upon further exploration, she  never did receive chemo or adjuvant therapy, and does not get tested  regularly for followup of endometrial cancer.  Her mother is still  alive.  That being said, she did have a transvaginal ultrasound done in  2007, and her lining was 11 mm.  This was elevated for postmenopausal  female; however, she was just entering menopause.  Given all of this  scenario, I am going to order transvaginal ultrasound to look at the  stripe and her ovaries to make sure everything is okay.  There could be  a random hCG, as she is going on, and we did UCG of her urine and it was  negative.   PAST MEDICAL HISTORY:  Psoriasis.   PAST SURGICAL HISTORY:  1. D&C.  2. C-section x2.   OBSTETRICAL HISTORY:  1.  C-section x2.  2. D&C.   GYNECOLOGIC HISTORY:  No history of ovarian cysts, fibroid, tumors,  sexually transmitted diseases, or Pap smears that are not normal.   FAMILY HISTORY:  Questionable endometrial cancer in her mother.   SOCIAL HISTORY:  No drinking, drugs, or alcohol.   MEDICATIONS:  1. Clobetasol cream.  2. Viactiv.  3. Calcium chews.  4. Fish oil.   ALLERGIES:  No known drug allergies.  No latex allergy.   HEALTH CARE MAINTENANCE:  Dr. Milinda Antis follows her for Pap smears and  mammograms, and I will leave this up to her.  The patient is also due  for colonoscopy.   REVIEW OF SYSTEMS:  Negative, especially negative for no postmenopausal  bleeding.   PHYSICAL EXAMINATION:  VITAL SIGNS:  Pulse 94, blood pressure 138/104.  The patient usually does not have hypertension, but she believes that  she is nervous.  Weight 212.  GENERAL:  Well nourished, well developed, in no apparent distress.  ABDOMEN:  Obese, soft, and  nontender.  No rebound.  No guarding.  No  organomegaly.  No hernia.  GENITALIA:  Tanner V.  Vagina pink, normal rugae.  Cervix closed and  nontender.  Uterus anteverted; however, difficult to palpate secondary  to body habitus.  Adnexa, no masses, nontender.  RECTUM:  No hemorrhoids.  EXTREMITIES:  Nontender.  No edema.   ASSESSMENT AND PLAN:  A 55 year old female with slightly elevated hCG  for unapparent reason.  1. UCG negative.  Urinalysis negative.  2. Transvaginal ultrasound ordered.  3. Come back in 2-3 weeks to follow up results of the ultrasound.  4. We will try to curbside Gynecology/Oncology to make sure there was      nothing else I should do.           ______________________________  Elsie Lincoln, MD     KL/MEDQ  D:  08/17/2007  T:  08/18/2007  Job:  [id number]   cc:   Dr. Milinda Antis

## 2010-10-15 ENCOUNTER — Other Ambulatory Visit: Payer: Self-pay | Admitting: Family Medicine

## 2010-10-15 DIAGNOSIS — Z1231 Encounter for screening mammogram for malignant neoplasm of breast: Secondary | ICD-10-CM

## 2010-10-30 ENCOUNTER — Ambulatory Visit
Admission: RE | Admit: 2010-10-30 | Discharge: 2010-10-30 | Disposition: A | Discharge status: Home / Self Care | Payer: Managed Care, Other (non HMO) | Source: Ambulatory Visit | Attending: Family Medicine | Admitting: Family Medicine

## 2010-10-30 DIAGNOSIS — Z1231 Encounter for screening mammogram for malignant neoplasm of breast: Secondary | ICD-10-CM

## 2010-11-06 ENCOUNTER — Other Ambulatory Visit: Payer: Self-pay | Admitting: Family Medicine

## 2010-11-06 DIAGNOSIS — R928 Other abnormal and inconclusive findings on diagnostic imaging of breast: Secondary | ICD-10-CM

## 2010-11-16 ENCOUNTER — Ambulatory Visit
Admission: RE | Admit: 2010-11-16 | Discharge: 2010-11-16 | Disposition: A | Discharge status: Home / Self Care | Payer: Managed Care, Other (non HMO) | Source: Ambulatory Visit | Attending: Family Medicine | Admitting: Family Medicine

## 2010-11-16 DIAGNOSIS — R928 Other abnormal and inconclusive findings on diagnostic imaging of breast: Secondary | ICD-10-CM

## 2010-11-20 ENCOUNTER — Encounter: Payer: Self-pay | Admitting: *Deleted

## 2011-10-29 ENCOUNTER — Other Ambulatory Visit: Payer: Self-pay | Admitting: Family Medicine

## 2011-10-29 DIAGNOSIS — Z1231 Encounter for screening mammogram for malignant neoplasm of breast: Secondary | ICD-10-CM

## 2011-12-10 ENCOUNTER — Ambulatory Visit
Admission: RE | Admit: 2011-12-10 | Discharge: 2011-12-10 | Disposition: A | Discharge status: Home / Self Care | Payer: Managed Care, Other (non HMO) | Source: Ambulatory Visit | Attending: Family Medicine | Admitting: Family Medicine

## 2011-12-10 DIAGNOSIS — Z1231 Encounter for screening mammogram for malignant neoplasm of breast: Secondary | ICD-10-CM

## 2011-12-14 ENCOUNTER — Encounter: Payer: Self-pay | Admitting: *Deleted

## 2012-03-19 ENCOUNTER — Telehealth: Payer: Self-pay | Admitting: Family Medicine

## 2012-03-19 DIAGNOSIS — E785 Hyperlipidemia, unspecified: Secondary | ICD-10-CM

## 2012-03-19 DIAGNOSIS — Z0000 Encounter for general adult medical examination without abnormal findings: Secondary | ICD-10-CM | POA: Insufficient documentation

## 2012-03-19 NOTE — Telephone Encounter (Signed)
Message copied by Judy Pimple on Sun Mar 19, 2012 12:28 PM ------      Message from: Baldomero Lamy      Created: Fri Mar 10, 2012  1:26 PM      Regarding: Cpx labs Mon 03/21/12       Please order  future cpx labs for pt's upcoming lab appt.      Thanks      Rodney Booze

## 2012-03-21 ENCOUNTER — Other Ambulatory Visit: Payer: Managed Care, Other (non HMO)

## 2012-03-22 ENCOUNTER — Telehealth: Payer: Self-pay | Admitting: Family Medicine

## 2012-03-22 DIAGNOSIS — Z0000 Encounter for general adult medical examination without abnormal findings: Secondary | ICD-10-CM

## 2012-03-22 DIAGNOSIS — E785 Hyperlipidemia, unspecified: Secondary | ICD-10-CM

## 2012-03-22 NOTE — Telephone Encounter (Signed)
Message copied by Judy Pimple on Wed Mar 22, 2012 10:28 PM ------      Message from: Alvina Chou      Created: Wed Mar 15, 2012  1:31 PM      Regarding: lab orders for Thursday, Mar 23, 2012       Patient is scheduled for CPX labs, please order future labs, Thanks , Camelia Eng

## 2012-03-23 ENCOUNTER — Other Ambulatory Visit (INDEPENDENT_AMBULATORY_CARE_PROVIDER_SITE_OTHER): Payer: Private Health Insurance - Indemnity

## 2012-03-23 DIAGNOSIS — E785 Hyperlipidemia, unspecified: Secondary | ICD-10-CM

## 2012-03-23 DIAGNOSIS — Z0000 Encounter for general adult medical examination without abnormal findings: Secondary | ICD-10-CM

## 2012-03-23 LAB — COMPREHENSIVE METABOLIC PANEL
BUN: 13 mg/dL (ref 6–23)
CO2: 28 mEq/L (ref 19–32)
Calcium: 9.2 mg/dL (ref 8.4–10.5)
Chloride: 105 mEq/L (ref 96–112)
Creatinine, Ser: 0.8 mg/dL (ref 0.4–1.2)
GFR: 76.31 mL/min (ref >60.00)
Glucose, Bld: 95 mg/dL (ref 70–99)
Total Bilirubin: 0.8 mg/dL (ref 0.3–1.2)

## 2012-03-23 LAB — CBC WITH DIFFERENTIAL/PLATELET
Basophils Relative: 0.5 % (ref 0.0–3.0)
Eosinophils Relative: 1.1 % (ref 0.0–5.0)
HCT: 43.2 % (ref 36.0–46.0)
Hemoglobin: 14.6 g/dL (ref 12.0–15.0)
Lymphs Abs: 2 10*3/uL (ref 0.7–4.0)
Monocytes Relative: 5.6 % (ref 3.0–12.0)
Neutro Abs: 5.4 10*3/uL (ref 1.4–7.7)
RBC: 4.95 Mil/uL (ref 3.87–5.11)
RDW: 13.6 % (ref 11.5–14.6)
WBC: 7.9 10*3/uL (ref 4.5–10.5)

## 2012-03-23 LAB — LIPID PANEL
Cholesterol: 228 mg/dL — ABNORMAL HIGH (ref 0–200)
HDL: 42.9 mg/dL (ref >39.00)
VLDL: 18 mg/dL (ref 0.0–40.0)

## 2012-03-28 ENCOUNTER — Other Ambulatory Visit (HOSPITAL_COMMUNITY)
Admission: RE | Admit: 2012-03-28 | Discharge: 2012-03-28 | Disposition: A | Discharge status: Home / Self Care | Payer: Private Health Insurance - Indemnity | Source: Ambulatory Visit | Attending: Family Medicine | Admitting: Family Medicine

## 2012-03-28 ENCOUNTER — Ambulatory Visit (INDEPENDENT_AMBULATORY_CARE_PROVIDER_SITE_OTHER): Payer: Private Health Insurance - Indemnity | Admitting: Family Medicine

## 2012-03-28 ENCOUNTER — Encounter: Payer: Self-pay | Admitting: Family Medicine

## 2012-03-28 VITALS — BP 126/76 | HR 73 | Temp 98.2°F | Ht 64.25 in | Wt 216.8 lb

## 2012-03-28 DIAGNOSIS — Z01419 Encounter for gynecological examination (general) (routine) without abnormal findings: Secondary | ICD-10-CM | POA: Insufficient documentation

## 2012-03-28 DIAGNOSIS — Z1151 Encounter for screening for human papillomavirus (HPV): Secondary | ICD-10-CM | POA: Insufficient documentation

## 2012-03-28 DIAGNOSIS — Z0000 Encounter for general adult medical examination without abnormal findings: Secondary | ICD-10-CM

## 2012-03-28 DIAGNOSIS — E785 Hyperlipidemia, unspecified: Secondary | ICD-10-CM

## 2012-03-28 NOTE — Assessment & Plan Note (Signed)
Not optimal control Pt declines statin at this time- wanting to improve her diet Disc goals for lipids and reasons to control them Rev labs with pt Rev low sat fat diet in detail  Will re check in 3 mo and decide if we need med at this time

## 2012-03-28 NOTE — Patient Instructions (Addendum)
Schedule fasting labs to re check cholesterol in 3 months Avoid red meat/ fried foods/ egg yolks/ fatty breakfast meats/ butter, cheese and high fat dairy/ and shellfish   Aim towards 30 minutes of exercise 5 days per week  Get amylactin otc for moisturizer

## 2012-03-28 NOTE — Progress Notes (Signed)
Subjective:    Patient ID: Nicole [name], female    DOB: 09/16/1955, 57 y.o.   MRN: [id number]  HPI Here for health maintenance exam and to review chronic medical problems    Is doing well and feeling good   Wt is up 9 lb with bmi of 36  Pap 3/11-needs her 3 year pap  Gyn- no problems at all   mammo 10/13 Self exam - no lumps or changes  Had a 3 D mammogram   Colon cancer screen- colonoscopy was 2011  Also had a pancreatic cyst investigated   Flu vaccine- got in the fall / sept   Mood- very good   Hyperlipidemia Lab Results  Component Value Date   CHOL 228* 03/23/2012   CHOL 212* 11/27/2009   CHOL 237* 08/14/2009   Lab Results  Component Value Date   HDL 42.90 03/23/2012   HDL 44.50 11/27/2009   HDL 44.20 08/14/2009   No results found for this basename: [facility]   Lab Results  Component Value Date   TRIG 90.0 03/23/2012   TRIG 77.0 11/27/2009   TRIG 86.0 08/14/2009   Lab Results  Component Value Date   CHOLHDL 5 03/23/2012   CHOLHDL 5 11/27/2009   CHOLHDL 5 08/14/2009   Lab Results  Component Value Date   LDLDIRECT 157.8 03/23/2012   LDLDIRECT 154.9 11/27/2009   LDLDIRECT 178.7 08/14/2009   this stays elevated  She brought it down in the past - wants to work on it and re check  Declines med at this time  She did cut red meat way back  Takes fish oil  Patient Active Problem List  Diagnosis  . HYPERLIPIDEMIA  . CYST AND PSEUDOCYST OF PANCREAS  . MICROSCOPIC HEMATURIA  . FIBROCYSTIC BREAST DISEASE  . ECZEMA, HANDS  . RASH AND OTHER NONSPECIFIC SKIN ERUPTION  . ABDOMINAL PAIN-RUQ  . NONSPECIFIC ABN FINDING RAD & OTH EXAM GI TRACT  . Routine general medical examination at a health care facility   No past medical history on file. No past surgical history on file. History  Substance Use Topics  . Smoking status: Never Smoker   . Smokeless tobacco: Not on file  . Alcohol Use: Yes     Comment: 1-2 glasses of wine a week   No family history on file. No  Known Allergies No current outpatient prescriptions on file prior to visit.   No current facility-administered medications on file prior to visit.      Review of Systems Review of Systems  Constitutional: Negative for fever, appetite change, fatigue and unexpected weight change.  Eyes: Negative for pain and visual disturbance.  Respiratory: Negative for cough and shortness of breath.   Cardiovascular: Negative for cp or palpitations    Gastrointestinal: Negative for nausea, diarrhea and constipation.  Genitourinary: Negative for urgency and frequency.  Skin: Negative for pallor or rash   Neurological: Negative for weakness, light-headedness, numbness and headaches.  Hematological: Negative for adenopathy. Does not bruise/bleed easily.  Psychiatric/Behavioral: Negative for dysphoric mood. The patient is not nervous/anxious.         Objective:   Physical Exam  Constitutional: She appears well-developed and well-nourished. No distress.  obese and well appearing   HENT:  Head: Normocephalic and atraumatic.  Mouth/Throat: Oropharynx is clear and moist.  Eyes: Conjunctivae and EOM are normal. Pupils are equal, round, and reactive to light. Right eye exhibits no discharge. Left eye exhibits no discharge.  Neck: Normal range of motion. Neck supple.  No JVD present. Carotid bruit is not present. No thyromegaly present.  Cardiovascular: Normal rate, regular rhythm, normal heart sounds and intact distal pulses.  Exam reveals no gallop.   Pulmonary/Chest: Effort normal and breath sounds normal. No respiratory distress. She has no wheezes.  Abdominal: Soft. Bowel sounds are normal. She exhibits no distension, no abdominal bruit and no mass. There is no tenderness.  Genitourinary: Vagina normal and uterus normal. No breast swelling, tenderness, discharge or bleeding. There is no rash, tenderness or lesion on the right labia. There is no rash, tenderness or lesion on the left labia. Uterus is not  enlarged and not tender. Cervix exhibits no motion tenderness, no discharge and no friability. Right adnexum displays no mass, no tenderness and no fullness. Left adnexum displays no mass, no tenderness and no fullness. No bleeding around the vagina. No vaginal discharge found.  Breast exam: No mass, nodules, thickening, tenderness, bulging, retraction, inflamation, nipple discharge or skin changes noted.  No axillary or clavicular LA.  Chaperoned exam.    Musculoskeletal: She exhibits no edema and no tenderness.  Lymphadenopathy:    She has no cervical adenopathy.  Neurological: She is alert. She has normal reflexes. No cranial nerve deficit. She exhibits normal muscle tone. Coordination normal.  Skin: Skin is warm and dry. No rash noted. No erythema. No pallor.  Psychiatric: She has a normal mood and affect.          Assessment & Plan:

## 2012-03-28 NOTE — Assessment & Plan Note (Signed)
Reviewed health habits including diet and exercise and skin cancer prevention Also reviewed health mt list, fam hx and immunizations  Wellness labs rev in detail  [facility]

## 2012-03-28 NOTE — Assessment & Plan Note (Signed)
3 year pap and exam done today No complaints

## 2012-04-04 ENCOUNTER — Encounter: Payer: Self-pay | Admitting: *Deleted

## 2012-06-26 ENCOUNTER — Other Ambulatory Visit: Payer: Private Health Insurance - Indemnity

## 2012-11-09 ENCOUNTER — Other Ambulatory Visit: Payer: Self-pay

## 2012-11-09 DIAGNOSIS — Z1231 Encounter for screening mammogram for malignant neoplasm of breast: Secondary | ICD-10-CM

## 2012-12-07 ENCOUNTER — Ambulatory Visit
Admission: RE | Admit: 2012-12-07 | Discharge: 2012-12-07 | Disposition: A | Discharge status: Home / Self Care | Payer: BC Managed Care – PPO | Source: Ambulatory Visit

## 2012-12-07 DIAGNOSIS — Z1231 Encounter for screening mammogram for malignant neoplasm of breast: Secondary | ICD-10-CM

## 2012-12-12 ENCOUNTER — Other Ambulatory Visit: Payer: Self-pay | Admitting: Family Medicine

## 2012-12-12 DIAGNOSIS — R928 Other abnormal and inconclusive findings on diagnostic imaging of breast: Secondary | ICD-10-CM

## 2013-01-01 ENCOUNTER — Other Ambulatory Visit: Payer: BC Managed Care – PPO

## 2013-01-02 ENCOUNTER — Ambulatory Visit
Admission: RE | Admit: 2013-01-02 | Discharge: 2013-01-02 | Disposition: A | Discharge status: Home / Self Care | Payer: BC Managed Care – PPO | Source: Ambulatory Visit | Attending: Family Medicine | Admitting: Family Medicine

## 2013-01-02 DIAGNOSIS — R928 Other abnormal and inconclusive findings on diagnostic imaging of breast: Secondary | ICD-10-CM

## 2013-05-30 ENCOUNTER — Other Ambulatory Visit: Payer: Self-pay | Admitting: Family Medicine

## 2013-05-30 DIAGNOSIS — N6489 Other specified disorders of breast: Secondary | ICD-10-CM

## 2013-06-28 ENCOUNTER — Ambulatory Visit
Admission: RE | Admit: 2013-06-28 | Discharge: 2013-06-28 | Disposition: A | Discharge status: Home / Self Care | Payer: BC Managed Care – PPO | Source: Ambulatory Visit | Attending: Family Medicine | Admitting: Family Medicine

## 2013-06-28 DIAGNOSIS — N6489 Other specified disorders of breast: Secondary | ICD-10-CM

## 2013-06-29 ENCOUNTER — Encounter: Payer: Self-pay | Admitting: *Deleted

## 2013-07-23 ENCOUNTER — Telehealth: Payer: Self-pay | Admitting: Family Medicine

## 2013-07-23 NOTE — Telephone Encounter (Signed)
Pt called and would like cpe beginning of August. Your next available cpe appt is not until November. Would you be able to accommodate?

## 2013-07-23 NOTE — Telephone Encounter (Signed)
Please put in any 30 min slot-thanks

## 2013-07-24 NOTE — Telephone Encounter (Signed)
Scheduled 09/17/13

## 2013-09-13 ENCOUNTER — Telehealth: Payer: Self-pay | Admitting: Family Medicine

## 2013-09-13 DIAGNOSIS — Z0000 Encounter for general adult medical examination without abnormal findings: Secondary | ICD-10-CM

## 2013-09-13 NOTE — Telephone Encounter (Signed)
Message copied by Abner Greenspan on Thu Sep 13, 2013  1:49 PM ------      Message from: Ellamae Sia      Created: Wed Sep 05, 2013  4:18 PM      Regarding: Lab orders for Friday, 7.31.15       Patient is scheduled for CPX labs, please order future labs, Thanks , Terri       ------

## 2013-09-14 ENCOUNTER — Other Ambulatory Visit (INDEPENDENT_AMBULATORY_CARE_PROVIDER_SITE_OTHER): Payer: BC Managed Care – PPO

## 2013-09-14 DIAGNOSIS — Z0000 Encounter for general adult medical examination without abnormal findings: Secondary | ICD-10-CM

## 2013-09-14 LAB — COMPREHENSIVE METABOLIC PANEL
ALBUMIN: 4 g/dL (ref 3.5–5.2)
ALT: 33 U/L (ref 0–35)
AST: 28 U/L (ref 0–37)
Alkaline Phosphatase: 79 U/L (ref 39–117)
BUN: 14 mg/dL (ref 6–23)
CALCIUM: 9.1 mg/dL (ref 8.4–10.5)
CHLORIDE: 106 meq/L (ref 96–112)
CO2: 29 mEq/L (ref 19–32)
Creatinine, Ser: 0.8 mg/dL (ref 0.4–1.2)
GFR: 79.25 mL/min (ref >60.00)
Glucose, Bld: 89 mg/dL (ref 70–99)
POTASSIUM: 4 meq/L (ref 3.5–5.1)
Sodium: 140 mEq/L (ref 135–145)
Total Bilirubin: 0.6 mg/dL (ref 0.2–1.2)
Total Protein: 7.2 g/dL (ref 6.0–8.3)

## 2013-09-14 LAB — CBC WITH DIFFERENTIAL/PLATELET
BASOS ABS: 0 10*3/uL (ref 0.0–0.1)
BASOS PCT: 0.5 % (ref 0.0–3.0)
EOS ABS: 0.4 10*3/uL (ref 0.0–0.7)
Eosinophils Relative: 4.7 % (ref 0.0–5.0)
HCT: 41.1 % (ref 36.0–46.0)
Hemoglobin: 13.9 g/dL (ref 12.0–15.0)
LYMPHS PCT: 30.2 % (ref 12.0–46.0)
Lymphs Abs: 2.4 10*3/uL (ref 0.7–4.0)
MCHC: 33.8 g/dL (ref 30.0–36.0)
MCV: 87.8 fl (ref 78.0–100.0)
MONOS PCT: 8.2 % (ref 3.0–12.0)
Monocytes Absolute: 0.6 10*3/uL (ref 0.1–1.0)
NEUTROS PCT: 56.4 % (ref 43.0–77.0)
Neutro Abs: 4.4 10*3/uL (ref 1.4–7.7)
Platelets: 225 10*3/uL (ref 150.0–400.0)
RBC: 4.68 Mil/uL (ref 3.87–5.11)
RDW: 13.6 % (ref 11.5–15.5)
WBC: 7.8 10*3/uL (ref 4.0–10.5)

## 2013-09-14 LAB — LIPID PANEL
CHOL/HDL RATIO: 5
Cholesterol: 229 mg/dL — ABNORMAL HIGH (ref 0–200)
HDL: 42.7 mg/dL (ref >39.00)
LDL Cholesterol: 163 mg/dL — ABNORMAL HIGH (ref 0–99)
NONHDL: 186.3
Triglycerides: 117 mg/dL (ref 0.0–149.0)
VLDL: 23.4 mg/dL (ref 0.0–40.0)

## 2013-09-14 LAB — TSH: TSH: 6.22 u[IU]/mL — ABNORMAL HIGH (ref 0.35–4.50)

## 2013-09-17 ENCOUNTER — Ambulatory Visit (INDEPENDENT_AMBULATORY_CARE_PROVIDER_SITE_OTHER): Payer: BC Managed Care – PPO | Admitting: Family Medicine

## 2013-09-17 ENCOUNTER — Encounter: Payer: Self-pay | Admitting: Family Medicine

## 2013-09-17 ENCOUNTER — Ambulatory Visit (INDEPENDENT_AMBULATORY_CARE_PROVIDER_SITE_OTHER)
Admission: RE | Admit: 2013-09-17 | Discharge: 2013-09-17 | Disposition: A | Discharge status: Home / Self Care | Payer: BC Managed Care – PPO | Source: Ambulatory Visit | Attending: Family Medicine | Admitting: Family Medicine

## 2013-09-17 VITALS — BP 132/80 | HR 86 | Temp 98.4°F | Ht 64.25 in | Wt 217.0 lb

## 2013-09-17 DIAGNOSIS — E039 Hypothyroidism, unspecified: Secondary | ICD-10-CM | POA: Insufficient documentation

## 2013-09-17 DIAGNOSIS — M79609 Pain in unspecified limb: Secondary | ICD-10-CM

## 2013-09-17 DIAGNOSIS — R7989 Other specified abnormal findings of blood chemistry: Secondary | ICD-10-CM

## 2013-09-17 DIAGNOSIS — M79671 Pain in right foot: Secondary | ICD-10-CM

## 2013-09-17 DIAGNOSIS — Z0000 Encounter for general adult medical examination without abnormal findings: Secondary | ICD-10-CM

## 2013-09-17 DIAGNOSIS — E785 Hyperlipidemia, unspecified: Secondary | ICD-10-CM

## 2013-09-17 NOTE — Progress Notes (Signed)
Pre visit review using our clinic review tool, if applicable. No additional management support is needed unless otherwise documented below in the visit note. 

## 2013-09-17 NOTE — Assessment & Plan Note (Signed)
Disc goals for lipids and reasons to control them Rev labs with pt Rev low sat fat diet in detail Pt is now open to statin if no imp with better diet  In 3 mo Disc low sat fat diet in detail

## 2013-09-17 NOTE — Assessment & Plan Note (Signed)
Will re check this in 3 mo with free T4 and tx if she is hypothyroid No clinical changes

## 2013-09-17 NOTE — Assessment & Plan Note (Signed)
Reviewed health habits including diet and exercise and skin cancer prevention Reviewed appropriate screening tests for age  Also reviewed health mt list, fam hx and immunization status , as well as social and family history   See HPI Labs reviewed  Wt loss encouraged

## 2013-09-17 NOTE — Patient Instructions (Signed)
Take care of yourself Xray of right foot today  Schedule fasting labs in 3 months  Keep working on healthy diet and exercise

## 2013-09-17 NOTE — Progress Notes (Signed)
Subjective:    Patient ID: Nicole [name], female    DOB: 15-Jul-1955, 58 y.o.   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 and was working with a Brewing technologist Lost 5 lb  Size went down one also- excited about that  Getting better with eating (since getting back from vacation)  Small steps -committed to a salad a day no matter what   Flu vaccine - had one last fall  Will get one at [facility]   Mammogram 5/15 nl   (due again in Oct)  Ultrasound prev to that  Self exam - no new lumps     Pap nl 2/14 -normal  No gyn problems   Td 3/11  colonosc 4/11 -10 year recall    Hyperlipidemia Lab Results  Component Value Date   CHOL 229* 09/14/2013   CHOL 228* 03/23/2012   CHOL 212* 11/27/2009   Lab Results  Component Value Date   HDL 42.70 09/14/2013   HDL 42.90 03/23/2012   HDL 44.50 11/27/2009   Lab Results  Component Value Date   LDLCALC 163* 09/14/2013   Lab Results  Component Value Date   TRIG 117.0 09/14/2013   TRIG 90.0 03/23/2012   TRIG 77.0 11/27/2009   Lab Results  Component Value Date   CHOLHDL 5 09/14/2013   CHOLHDL 5 03/23/2012   CHOLHDL 5 11/27/2009   Lab Results  Component Value Date   LDLDIRECT 157.8 03/23/2012   LDLDIRECT 154.9 11/27/2009   LDLDIRECT 178.7 08/14/2009   has hereditary high cholesterol  She declines cholesterol med (her sisters had reactions to it)- but would consider it if  no improvement in 3 mo after much improved diet      tsh up a bit this visit Wonders if this adds to her challenges loosing weight-but no clinical symptoms  Lab Results  Component Value Date   TSH 6.22* 09/14/2013     Patient Active Problem List   Diagnosis Date Noted  . Abnormal TSH 09/17/2013  . Right foot pain 09/17/2013  . Encounter for routine gynecological examination 03/28/2012  . Routine general medical examination at a health care facility 03/19/2012  . CYST AND PSEUDOCYST OF PANCREAS 12/31/2009  . ABDOMINAL PAIN-RUQ 12/08/2009  . Rash and other nonspecific skin eruption 09/24/2009  . NONSPECIFIC ABN FINDING RAD & OTH EXAM GI TRACT 04/07/2009  . MICROSCOPIC HEMATURIA 03/13/2009  . HYPERLIPIDEMIA 08/02/2007  . FIBROCYSTIC BREAST DISEASE 08/02/2007  . ECZEMA, HANDS 08/02/2007   No past medical history on file. No past surgical history on file. History  Substance Use Topics  . Smoking status: Never Smoker   . Smokeless tobacco: Not on file  . Alcohol Use: Yes     Comment: 1-2 glasses of wine a week  No family history on file. No Known Allergies No current outpatient prescriptions on file prior to visit.   No current facility-administered medications on file prior to visit.    Review of Systems Review of Systems  Constitutional: Negative for fever, appetite change, fatigue and unexpected weight change.  Eyes: Negative for pain and visual disturbance.  Respiratory: Negative for cough and shortness of breath.   Cardiovascular: Negative for cp or palpitations    Gastrointestinal: Negative for nausea, diarrhea and constipation.  Genitourinary: Negative for urgency and frequency.  Skin: Negative for pallor or rash   MSK pos for R foot pain after injury  Neurological: Negative for weakness, light-headedness, numbness and headaches.  Hematological: Negative for adenopathy. Does not bruise/bleed easily.  Psychiatric/Behavioral: Negative for dysphoric mood. The  patient is not nervous/anxious.         Objective:   Physical Exam  Constitutional: She appears well-developed and well-nourished. No distress.  obese and well appearing   HENT:  Head: Normocephalic and atraumatic.  Right Ear: External ear normal.  Left Ear: External ear normal.  Mouth/Throat: Oropharynx is clear and moist.  Eyes: Conjunctivae and EOM are normal. Pupils are equal, round, and reactive to light. No scleral icterus.  Neck: Normal range of motion. Neck supple. No JVD present. Carotid bruit is not present. No thyromegaly present.  Cardiovascular: Normal rate, regular rhythm, normal heart sounds and intact distal pulses.  Exam reveals no gallop.   Pulmonary/Chest: Effort normal and breath sounds normal. No respiratory distress. She has no wheezes. She exhibits no tenderness.  Abdominal: Soft. Bowel sounds are normal. She exhibits no distension, no abdominal bruit and no mass. There is no tenderness.  Genitourinary: No breast swelling, tenderness, discharge or bleeding.  Breast exam: No mass, nodules, thickening, tenderness, bulging, retraction, inflamation, nipple discharge or skin changes noted.  No axillary or clavicular LA.      Musculoskeletal: Normal range of motion. She exhibits tenderness. She exhibits no edema.  R foot- medial bunion deformity noted , tender first MTP joint with scant erythema and old ecchymosis  No crepitus  Nl rom -pain on full flexion   Lymphadenopathy:    She has no cervical adenopathy.  Neurological: She is alert. She has normal reflexes. No cranial nerve deficit. She exhibits normal muscle tone. Coordination normal.  Skin: Skin is warm and dry. No rash noted. No erythema. No pallor.  Psychiatric: She has a normal mood and affect.          Assessment & Plan:   Problem List Items Addressed This Visit     Other   HYPERLIPIDEMIA     Disc goals for lipids and reasons to control them Rev labs with pt Rev low sat fat diet in detail Pt is  now open to statin if no imp with better diet  In 3 mo Disc low sat fat diet in detail     Relevant Orders      Lipid panel   Routine general medical examination at a health care facility - Primary     Reviewed health habits including diet and exercise and skin cancer prevention Reviewed appropriate screening tests for age  Also reviewed health mt list, fam hx and immunization status , as well as social and family history   See HPI Labs reviewed  Wt loss encouraged     Abnormal TSH     Will re check this in 3 mo with free T4 and tx if she is hypothyroid No clinical changes  Relevant Orders      TSH   Right foot pain     Pain in R MTP joint after injury a month ago  Mild swelling  Nl rom foot  Xray today- suspect deg change/doubt fx     Relevant Orders      DG Foot Complete Right (Completed)

## 2013-09-17 NOTE — Assessment & Plan Note (Signed)
Pain in R MTP joint after injury a month ago  Mild swelling  Nl rom foot  Xray today- suspect deg change/doubt fx

## 2013-09-19 ENCOUNTER — Encounter: Payer: BC Managed Care – PPO | Admitting: Family Medicine

## 2013-10-05 ENCOUNTER — Telehealth: Payer: Self-pay | Admitting: Emergency Medicine

## 2013-10-05 NOTE — Telephone Encounter (Signed)
Patient dropped off a health screening form from her employer to be filled out by Dr Glori Bickers. Cell phone is the best number to call her back [phone]. Put on [employer].

## 2013-10-05 NOTE — Telephone Encounter (Signed)
I signed this - she will have to measure her waist and call in result to complete form In IN box

## 2013-10-05 NOTE — Telephone Encounter (Signed)
Form placed in your inbox

## 2013-10-08 NOTE — Telephone Encounter (Signed)
Left voicemail requesting pt to call office 

## 2013-10-08 NOTE — Telephone Encounter (Signed)
Pt left v/m requesting cb [phone].

## 2013-10-08 NOTE — Telephone Encounter (Signed)
Called pt and someone answered call but never said anything, will try to call back later

## 2013-10-08 NOTE — Telephone Encounter (Signed)
Pt stopped by office, I measured waist and faxed form to employer

## 2013-11-15 ENCOUNTER — Other Ambulatory Visit: Payer: Self-pay

## 2013-11-15 DIAGNOSIS — Z1231 Encounter for screening mammogram for malignant neoplasm of breast: Secondary | ICD-10-CM

## 2013-12-17 ENCOUNTER — Ambulatory Visit: Payer: BC Managed Care – PPO

## 2013-12-19 ENCOUNTER — Other Ambulatory Visit: Payer: BC Managed Care – PPO

## 2014-01-02 ENCOUNTER — Ambulatory Visit
Admission: RE | Admit: 2014-01-02 | Discharge: 2014-01-02 | Disposition: A | Discharge status: Home / Self Care | Payer: BC Managed Care – PPO | Source: Ambulatory Visit

## 2014-01-02 ENCOUNTER — Encounter (INDEPENDENT_AMBULATORY_CARE_PROVIDER_SITE_OTHER): Payer: Self-pay

## 2014-01-02 DIAGNOSIS — Z1231 Encounter for screening mammogram for malignant neoplasm of breast: Secondary | ICD-10-CM

## 2014-01-03 ENCOUNTER — Encounter: Payer: Self-pay | Admitting: *Deleted

## 2014-01-14 ENCOUNTER — Other Ambulatory Visit (INDEPENDENT_AMBULATORY_CARE_PROVIDER_SITE_OTHER): Payer: BC Managed Care – PPO

## 2014-01-14 ENCOUNTER — Other Ambulatory Visit: Payer: BC Managed Care – PPO

## 2014-01-14 DIAGNOSIS — R7989 Other specified abnormal findings of blood chemistry: Secondary | ICD-10-CM

## 2014-01-14 DIAGNOSIS — E785 Hyperlipidemia, unspecified: Secondary | ICD-10-CM

## 2014-01-14 LAB — LIPID PANEL
CHOLESTEROL: 234 mg/dL — AB (ref 0–200)
HDL: 42.2 mg/dL (ref >39.00)
LDL Cholesterol: 167 mg/dL — ABNORMAL HIGH (ref 0–99)
NonHDL: 191.8
Total CHOL/HDL Ratio: 6
Triglycerides: 125 mg/dL (ref 0.0–149.0)
VLDL: 25 mg/dL (ref 0.0–40.0)

## 2014-01-14 LAB — TSH: TSH: 5.74 u[IU]/mL — AB (ref 0.35–4.50)

## 2014-01-14 LAB — T4, FREE: Free T4: 0.69 ng/dL (ref 0.60–1.60)

## 2014-01-16 ENCOUNTER — Encounter: Payer: Self-pay | Admitting: Family Medicine

## 2014-01-16 ENCOUNTER — Ambulatory Visit (INDEPENDENT_AMBULATORY_CARE_PROVIDER_SITE_OTHER): Payer: BC Managed Care – PPO | Admitting: Family Medicine

## 2014-01-16 VITALS — BP 128/82 | HR 92 | Temp 98.6°F | Ht 64.25 in | Wt 214.0 lb

## 2014-01-16 DIAGNOSIS — E038 Other specified hypothyroidism: Secondary | ICD-10-CM

## 2014-01-16 DIAGNOSIS — E785 Hyperlipidemia, unspecified: Secondary | ICD-10-CM

## 2014-01-16 MED ORDER — ATORVASTATIN CALCIUM 10 MG PO TABS: 10.0000 mg | ORAL_TABLET | Freq: Every day | ORAL | Status: DC

## 2014-01-16 MED ORDER — LEVOTHYROXINE SODIUM 25 MCG PO TABS: 25.0000 ug | ORAL_TABLET | Freq: Every day | ORAL | Status: DC

## 2014-01-16 NOTE — Progress Notes (Signed)
Subjective:    Patient ID: Nicole [name], female    DOB: [birth year], 58 y.o.   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)   fam hx of hypothyroid also    Hyperlipidemia Lab Results  Component Value Date   CHOL 234* 01/14/2014   CHOL 229* 09/14/2013   CHOL 228* 03/23/2012   Lab Results  Component Value Date   HDL 42.20 01/14/2014   HDL 42.70 09/14/2013   HDL 42.90 03/23/2012   Lab Results  Component Value Date   LDLCALC 167* 01/14/2014   LDLCALC 163* 09/14/2013   Lab Results  Component Value Date   TRIG 125.0 01/14/2014   TRIG 117.0 09/14/2013   TRIG 90.0 03/23/2012   Lab Results  Component Value Date   CHOLHDL 6 01/14/2014   CHOLHDL 5 09/14/2013   CHOLHDL 5 03/23/2012   Lab Results  Component Value Date   LDLDIRECT 157.8 03/23/2012   LDLDIRECT 154.9 11/27/2009   LDLDIRECT 178.7 08/14/2009   thinks this is hereditary  All 3 sisters are on cholesterol medicine  She has eliminated most sat fats from diet with no imp in chol  Patient Active Problem List   Diagnosis Date Noted  . Abnormal TSH 09/17/2013  . Right foot pain 09/17/2013  . Encounter for routine gynecological examination 03/28/2012  . Routine general medical examination at a health care facility 03/19/2012  . CYST AND PSEUDOCYST OF PANCREAS 12/31/2009  . ABDOMINAL PAIN-RUQ 12/08/2009  . Rash and other nonspecific skin eruption 09/24/2009  . NONSPECIFIC ABN FINDING RAD & OTH EXAM GI TRACT 04/07/2009  . MICROSCOPIC HEMATURIA 03/13/2009  . Hyperlipidemia LDL  goal <130 08/02/2007  . FIBROCYSTIC BREAST DISEASE 08/02/2007  . ECZEMA, HANDS 08/02/2007   No past medical history on file. No past surgical history on file. History  Substance Use Topics  . Smoking status: Never Smoker   . Smokeless tobacco: Not on file  . Alcohol Use: 0.0 oz/week    0 Not specified per week     Comment: 1-2 glasses of wine a week   No family history on file. No Known Allergies No current outpatient prescriptions on file prior to visit.   No current facility-administered medications on file prior to visit.    Review of Systems Review of Systems  Constitutional: Negative for fever, appetite change,  and unexpected weight change.  Eyes: Negative for pain and visual disturbance.  Respiratory: Negative for cough and shortness of breath.   Cardiovascular: Negative for cp or palpitations    Gastrointestinal: Negative for nausea, diarrhea and constipation.  Genitourinary: Negative for urgency and frequency.  Skin: Negative for pallor or rash   Neurological: Negative for weakness, light-headedness, numbness and headaches.  Hematological: Negative for adenopathy. Does not bruise/bleed easily.  Psychiatric/Behavioral: Negative for dysphoric mood. The patient is not nervous/anxious.         Objective:   Physical Exam  Constitutional: She appears well-developed and well-nourished. No distress.  obese  and well appearing   HENT:  Head: Normocephalic and atraumatic.  Mouth/Throat: Oropharynx is clear and moist.  Eyes: Conjunctivae and EOM are normal. Pupils are equal, round, and reactive to light. No scleral icterus.  Neck: Normal range of motion. Neck supple. Carotid bruit is not present. No thyromegaly present.  Cardiovascular: Normal rate, regular rhythm, normal heart sounds and intact distal pulses.  Exam reveals no gallop.   Pulmonary/Chest: Effort normal and breath sounds normal. No respiratory distress. She has no wheezes. She has no rales.  Musculoskeletal:  She exhibits no edema.  Lymphadenopathy:    She has no cervical adenopathy.  Neurological: She is alert. She displays no tremor.  Skin: Skin is warm and dry. No rash noted. No erythema. No pallor.  Psychiatric: She has a normal mood and affect.          Assessment & Plan:   Problem List Items Addressed This Visit      Other   Abnormal TSH    tsh high and free T4 low nl  Will start levothyroxine 25 mcg and check lab in 6 wk Will update if side eff     Hyperlipidemia LDL goal <130 - Primary    With good diet  Suspect hereditary Disc tx opt -will start atorvastatin 10 mg daily in evening  Rev side eff in detail  Lab 6 wk     Relevant Medications      atorvastatin (LIPITOR) tablet

## 2014-01-16 NOTE — Progress Notes (Signed)
Pre visit review using our clinic review tool, if applicable. No additional management support is needed unless otherwise documented below in the visit note. 

## 2014-01-16 NOTE — Assessment & Plan Note (Signed)
tsh high and free T4 low nl  Will start levothyroxine 25 mcg and check lab in 6 wk Will update if side eff

## 2014-01-16 NOTE — Patient Instructions (Signed)
Start thyroid medicine and cholesterol medicine  Take thyroid in am (do not take within 2 hours of any vitamins) and take cholesterol medicine (atorvastatin) in the evening Schedule fasting labs in 6 weeks Eat a low cholesterol diet Avoid red meat/ fried foods/ egg yolks/ fatty breakfast meats/ butter, cheese and high fat dairy/ and shellfish   If side still hurts in 2 weeks let me know

## 2014-01-16 NOTE — Assessment & Plan Note (Signed)
With good diet  Suspect hereditary Disc tx opt -will start atorvastatin 10 mg daily in evening  Rev side eff in detail  Lab 6 wk

## 2014-03-18 ENCOUNTER — Other Ambulatory Visit (INDEPENDENT_AMBULATORY_CARE_PROVIDER_SITE_OTHER): Payer: BLUE CROSS/BLUE SHIELD

## 2014-03-18 DIAGNOSIS — E785 Hyperlipidemia, unspecified: Secondary | ICD-10-CM

## 2014-03-18 DIAGNOSIS — E038 Other specified hypothyroidism: Secondary | ICD-10-CM

## 2014-03-18 LAB — LIPID PANEL
Cholesterol: 158 mg/dL (ref 0–200)
HDL: 52.2 mg/dL (ref >39.00)
LDL Cholesterol: 76 mg/dL (ref 0–99)
NONHDL: 105.8
Total CHOL/HDL Ratio: 3
Triglycerides: 151 mg/dL — ABNORMAL HIGH (ref 0.0–149.0)
VLDL: 30.2 mg/dL (ref 0.0–40.0)

## 2014-03-18 LAB — ALT: ALT: 27 U/L (ref 0–35)

## 2014-03-18 LAB — TSH: TSH: 4.25 u[IU]/mL (ref 0.35–4.50)

## 2014-03-18 LAB — AST: AST: 21 U/L (ref 0–37)

## 2014-03-19 ENCOUNTER — Encounter: Payer: Self-pay | Admitting: *Deleted

## 2014-03-25 ENCOUNTER — Telehealth: Payer: Self-pay

## 2014-03-25 MED ORDER — LEVOTHYROXINE SODIUM 25 MCG PO TABS: 25.0000 ug | ORAL_TABLET | Freq: Every day | ORAL | Status: DC

## 2014-03-25 NOTE — Telephone Encounter (Signed)
Pt received letter re to 03/18/14 lab results with TSH being 4.25. Pt understood to continue taking cholesterol med but wants to know what to do about thyroid med. Pt was started on Levothyroxine 25 mcg on 01/16/14. Pt wants to know if she should continue that dosage or what to do. Pt request cb.

## 2014-03-25 NOTE — Telephone Encounter (Signed)
Notified patient of Dr Marliss Coots comments. Patient request for refill. Rx sent to [facility].

## 2014-03-25 NOTE — Telephone Encounter (Signed)
Yes -stay on that dose Refill for the year if needed, thanks

## 2014-04-12 NOTE — Telephone Encounter (Signed)
Pt called back and advised as previously noted; pt voiced understanding.

## 2014-04-12 NOTE — Telephone Encounter (Signed)
Pt left v/m wanting to know why only gets 30 day refill instead of 90 day rx. Spoke with Alyse Low at [facility] and pt had active levothyroxine rx # 30 and #90 rx sent to [facility] on 03/25/14 was placed on hold. Christy d/c the # 30 rx so next refill request will be #90. Left v/m for pt to cb.

## 2014-05-29 ENCOUNTER — Ambulatory Visit: Payer: BLUE CROSS/BLUE SHIELD | Admitting: Family Medicine

## 2014-06-14 ENCOUNTER — Ambulatory Visit (INDEPENDENT_AMBULATORY_CARE_PROVIDER_SITE_OTHER)
Admission: RE | Admit: 2014-06-14 | Discharge: 2014-06-14 | Disposition: A | Discharge status: Home / Self Care | Payer: BLUE CROSS/BLUE SHIELD | Source: Ambulatory Visit | Attending: Family Medicine | Admitting: Family Medicine

## 2014-06-14 ENCOUNTER — Ambulatory Visit (INDEPENDENT_AMBULATORY_CARE_PROVIDER_SITE_OTHER): Payer: BLUE CROSS/BLUE SHIELD | Admitting: Family Medicine

## 2014-06-14 ENCOUNTER — Encounter: Payer: Self-pay | Admitting: Family Medicine

## 2014-06-14 VITALS — BP 128/68 | HR 80 | Temp 98.2°F | Ht 64.25 in | Wt 222.2 lb

## 2014-06-14 DIAGNOSIS — M25561 Pain in right knee: Secondary | ICD-10-CM | POA: Diagnosis not present

## 2014-06-14 MED ORDER — MELOXICAM 15 MG PO TABS
15.0000 mg | ORAL_TABLET | Freq: Every day | ORAL | Status: DC
Start: 2014-06-14 — End: 2014-07-11

## 2014-06-14 NOTE — Patient Instructions (Signed)
Take mobic for a couple of weeks to calm down knee inflammation and pain  Get a knee brace with hole in the middle to help patellar tracking - and wear it when walking  When not walking-elevate leg and use ice on knee as often as you can  Xray now -will update you with result

## 2014-06-14 NOTE — Progress Notes (Signed)
Pre visit review using our clinic review tool, if applicable. No additional management support is needed unless otherwise documented below in the visit note. 

## 2014-06-14 NOTE — Progress Notes (Signed)
Subjective:    Patient ID: Nicole [name], female    DOB: Apr 17, 1955, 59 y.o.   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 Hypothyroid 09/17/2013  . Right foot pain 09/17/2013  . Encounter for routine gynecological examination 03/28/2012  . Routine general medical examination at a health care facility 03/19/2012  . CYST AND PSEUDOCYST OF PANCREAS 12/31/2009  . ABDOMINAL PAIN-RUQ 12/08/2009  . Rash and other nonspecific skin eruption 09/24/2009  . NONSPECIFIC ABN FINDING RAD & OTH EXAM GI TRACT 04/07/2009  . MICROSCOPIC HEMATURIA 03/13/2009  . Hyperlipidemia LDL goal <130 08/02/2007  . FIBROCYSTIC BREAST DISEASE 08/02/2007  . ECZEMA, HANDS 08/02/2007   No past medical history on file. No past surgical history on file. History  Substance Use Topics  . Smoking status: Never Smoker   . Smokeless tobacco: Not on file  . Alcohol Use: 0.0 oz/week    0 Standard drinks or equivalent per week     Comment: 1-2 glasses of wine a week   No family history on file. No Known Allergies Current Outpatient Prescriptions on File Prior to Visit  Medication Sig Dispense Refill  . atorvastatin (LIPITOR) 10 MG tablet Take 1 tablet (10 mg total) by mouth daily. 30 tablet 11  . levothyroxine (SYNTHROID, LEVOTHROID) 25 MCG tablet Take 1 tablet (25 mcg total) by mouth daily before breakfast. 90 tablet 3   No current facility-administered medications on file prior to  visit.      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  Eyes: Negative for pain and visual disturbance.  Respiratory: Negative for cough and shortness of breath.   Cardiovascular: Negative for cp or palpitations    Gastrointestinal: Negative for nausea, diarrhea and constipation.  Genitourinary: Negative for urgency and frequency.  Skin: Negative for pallor or rash   MSK pos for R knee pain w/o other joint problems Neurological: Negative for weakness, light-headedness, numbness and headaches.  Hematological: Negative for adenopathy. Does not bruise/bleed easily.  Psychiatric/Behavioral: Negative for dysphoric mood. The patient is not nervous/anxious.         Objective:   Physical Exam  Constitutional: She appears well-developed and well-nourished. No distress.  obese and well appearing   Neck: Normal range of motion. Neck supple.  Cardiovascular: Normal rate and regular rhythm.   Musculoskeletal: She exhibits tenderness. She exhibits no edema.       Right knee: She exhibits decreased range of motion and abnormal patellar mobility. She exhibits no swelling, no effusion, no ecchymosis, no deformity, no LCL laxity,  no bony tenderness, normal meniscus and no MCL laxity. Tenderness found. Patellar tendon tenderness noted.  Tender over patellar tendon - slt over med joint line No eff detected Stable on drawer/lachman exams  Pain on full flex Pos mc murray test  Some crepitus  Neurological: She is alert. She has normal reflexes. She exhibits normal muscle tone. Coordination normal.  Skin: Skin is warm and dry.  Psychiatric: She has a normal mood and affect.          Assessment & Plan:   Problem List Items Addressed This Visit      Other   Knee pain, right - Primary    Suspect  patellar tracking problem  rec neoprene knee brace with patellar stabilizing hole  tx with mobic 15 daily with food  Relative rest for high impact activities along with elevation and ice  xr now  May benefit from PT        Relevant Orders   DG Knee Complete 4 Views Right (Completed)

## 2014-06-16 NOTE — Assessment & Plan Note (Signed)
Suspect patellar tracking problem  rec neoprene knee brace with patellar stabilizing hole  tx with mobic 15 daily with food  Relative rest for high impact activities along with elevation and ice  xr now  May benefit from PT

## 2014-07-10 ENCOUNTER — Encounter: Payer: Self-pay | Admitting: Internal Medicine

## 2014-07-11 ENCOUNTER — Ambulatory Visit (INDEPENDENT_AMBULATORY_CARE_PROVIDER_SITE_OTHER): Payer: BLUE CROSS/BLUE SHIELD | Admitting: Family Medicine

## 2014-07-11 ENCOUNTER — Encounter: Payer: Self-pay | Admitting: Family Medicine

## 2014-07-11 VITALS — BP 120/76 | HR 73 | Temp 98.4°F | Ht 64.25 in | Wt 221.2 lb

## 2014-07-11 DIAGNOSIS — M2391 Unspecified internal derangement of right knee: Secondary | ICD-10-CM | POA: Diagnosis not present

## 2014-07-11 DIAGNOSIS — M25561 Pain in right knee: Secondary | ICD-10-CM

## 2014-07-11 MED ORDER — METHYLPREDNISOLONE ACETATE 40 MG/ML IJ SUSP
80.0000 mg | Freq: Once | INTRAMUSCULAR | Status: AC
  Administered 2014-07-11: 80 mg via INTRA_ARTICULAR

## 2014-07-11 NOTE — Patient Instructions (Signed)
Motrin 800 mg recommended TID. (Over the counter Motrin, Advil, or Generic Ibuprofen 200 mg tablets. 3-4 tablets by mouth 3 times a day. This equals a prescription strength dose.)

## 2014-07-11 NOTE — Progress Notes (Signed)
Pre visit review using our clinic review tool, if applicable. No additional management support is needed unless otherwise documented below in the visit note. 

## 2014-07-11 NOTE — Progress Notes (Signed)
Dr. Frederico Hamman T. Mahari Strahm, MD, [facility] and Sports Medicine [place], [address] Phone: [phone] Fax: [phone]  07/11/2014  Patient: Nicole [name], MRN: [id number], DOB: 1955/05/24, 59 y.o.  Primary Physician:  Loura Pardon, MD  Chief Complaint: Knee Pain  Subjective:   Nicole [name] is a 59 y.o. very pleasant female patient who presents with the following:  Consulting MD: Dr. Glori Bickers  Very pleasant patient who has a BMI of 38 who presents with right-sided knee pain that is been ongoing for approximately 2-3 months.  She was working on her house and doing some painting, and she recalls a specific incident when she twisted her knee coming off of a ladder, and since that point it is been quite tender.  Prior to a few months ago, she was not having any problems at all, and her right knee was similar to her left knee at baseline, which is completely asymptomatic.  She has had some mild effusions and she has pain with bending and rotating on her knee.  No prior history of knee trauma, fracture, or operative intervention.  She has taken some occasional ibuprofen.  Has been doing a lot of painting. When stopped - cancelled appointment and saw Roque Lias a few weeks ago.   Past Medical History, Surgical History, Social History, Family History, Problem List, Medications, and Allergies have been reviewed and updated if relevant.  GEN: No fevers, chills. Nontoxic. Primarily MSK c/o today. MSK: Detailed in the HPI GI: tolerating PO intake without difficulty Neuro: No numbness, parasthesias, or tingling associated. Otherwise the pertinent positives of the ROS are noted above.   Objective:   BP 120/76 mmHg  Pulse 73  Temp(Src) 98.4 F (36.9 C) (Oral)  Ht 5' 4.25" (1.632 m)  Wt 221 lb 4 oz (100.358 kg)  BMI 37.68 kg/m2   GEN: WDWN, NAD, Non-toxic, Alert & Oriented x 3 HEENT: Atraumatic, Normocephalic.  Ears and Nose: No external  deformity. EXTR: No clubbing/cyanosis/edema NEURO: Normal gait.  PSYCH: Normally interactive. Conversant. Not depressed or anxious appearing.  Calm demeanor.    R Knee: left knee with full extension and Flexion to 130.  Right knee with full extension and flexion to 105.  There is a mild effusion.  No tenderness with patellar manipulation.  No significant crepitus.  Patellar facets are nontender.  Medial joint line is markedly tender to palpation throughout its length.  Lateral joint line is tender as well to a lesser degree.  ACL, PCL, MCL, and LCL are all stable.  Lachman is negative.  Bounce home causes pain.  Flexion pinch causes exquisite pain.  McMurray's causes pain but no mechanical symptoms.  Radiology: Dg Knee Complete 4 Views Right  06/14/2014   CLINICAL DATA:  Right medial knee pain for 1 month.  EXAM: RIGHT KNEE - COMPLETE 4+ VIEW  COMPARISON:  None.  FINDINGS: No acute fracture or dislocation is identified. There is a small knee joint effusion. There is mild medial and patellofemoral compartment joint space narrowing and marginal osteophytosis. There is mild spurring of the tibial spines. No lytic or blastic osseous lesion is seen.  IMPRESSION: Mild medial and patellofemoral compartment osteoarthrosis.   Electronically Signed   By: Logan Bores   On: 06/14/2014 15:46   The radiological images were independently reviewed by myself in the office and results were reviewed with the patient. My independent interpretation of images:  trauma series, nonweightbearing films. On nonweightbearing trauma series, there is  some minimal amount of osteoarthritic change.  In the patellofemoral compartment there is superior spur and some small degree of joint space loss, more notable in the patellofemoral compartment and medial compartments.  There is no fracture or dislocation apparent. Electronically Signed  By: Owens Loffler, MD On: 07/12/2014 9:30 AM   Assessment and Plan:   Right knee pain -  Plan: methylPREDNISolone acetate (DEPO-MEDROL) injection 80 mg  Derangement, knee internal, right  Minimal OA on films and baseline was doing great prior to knee injury. Most likely internal derangement, most concerned with medial meniscal tear.   We discussed options, conservative care vs. Early MRI and arthroscopy, and she opts for conservative trial.   Motrin 800 mg recommended TID.   Knee Injection, R Patient verbally consented to procedure. Risks (including potential rare risk of infection), benefits, and alternatives explained. Sterilely prepped with Chloraprep. Ethyl cholride used for anesthesia. 8 cc Lidocaine 1% mixed with Depo-Medrol 80 mg injected using the anteromedial approach without difficulty. No complications with procedure and tolerated well. Patient had decreased pain post-injection.   I appreciate the opportunity to evaluate this very friendly patient. If you have any question regarding her care or prognosis, do not hesitate to ask.   Follow-up: Return in about 1 month (around 08/11/2014).  Signed,  Maud Deed. Martrice Apt, MD   Patient's Medications  New Prescriptions   No medications on file  Previous Medications   ATORVASTATIN (LIPITOR) 10 MG TABLET    Take 1 tablet (10 mg total) by mouth daily.   LEVOTHYROXINE (SYNTHROID, LEVOTHROID) 25 MCG TABLET    Take 1 tablet (25 mcg total) by mouth daily before breakfast.  Modified Medications   No medications on file  Discontinued Medications   MELOXICAM (MOBIC) 15 MG TABLET    Take 1 tablet (15 mg total) by mouth daily. With food daily for knee pain

## 2014-08-12 ENCOUNTER — Ambulatory Visit (INDEPENDENT_AMBULATORY_CARE_PROVIDER_SITE_OTHER): Payer: BLUE CROSS/BLUE SHIELD | Admitting: Family Medicine

## 2014-08-12 ENCOUNTER — Encounter: Payer: Self-pay | Admitting: Family Medicine

## 2014-08-12 VITALS — BP 126/78 | HR 69 | Temp 97.8°F | Ht 64.25 in | Wt 223.0 lb

## 2014-08-12 DIAGNOSIS — M2391 Unspecified internal derangement of right knee: Secondary | ICD-10-CM | POA: Diagnosis not present

## 2014-08-12 DIAGNOSIS — M25561 Pain in right knee: Secondary | ICD-10-CM

## 2014-08-12 NOTE — Progress Notes (Signed)
Pre visit review using our clinic review tool, if applicable. No additional management support is needed unless otherwise documented below in the visit note. 

## 2014-08-12 NOTE — Progress Notes (Addendum)
Dr. Frederico Hamman T. Amonda Brillhart, MD, [facility] and Sports Medicine [place], [address] Phone: [phone] Fax: [phone]  08/12/2014  Patient: Nicole [name], MRN: [id number], DOB: 1955/06/27, 59 y.o.  Primary Physician:  Loura Pardon, MD  Chief Complaint: Follow-up  Subjective:   Nicole [name] is a 59 y.o. very pleasant female patient who presents with the following:  R knee, was great until about 4-5 days ago. Was not really swollen into a few days ago. She has been very active and doing a lot of remodeling in her house.  Her knee is been feeling quite good up until a few days ago.  She has not been having any buckling or mechanical giving way of her knee.  Time for now  07/11/2014 Last OV with Owens Loffler, MD  Consulting MD: Dr. Glori Bickers  Very pleasant patient who has a BMI of 38 who presents with right-sided knee pain that is been ongoing for approximately 2-3 months.  She was working on her house and doing some painting, and she recalls a specific incident when she twisted her knee coming off of a ladder, and since that point it is been quite tender.  Prior to a few months ago, she was not having any problems at all, and her right knee was similar to her left knee at baseline, which is completely asymptomatic.  She has had some mild effusions and she has pain with bending and rotating on her knee.  No prior history of knee trauma, fracture, or operative intervention.  She has taken some occasional ibuprofen.  Has been doing a lot of painting. When stopped - cancelled appointment and saw Roque Lias a few weeks ago.   Past Medical History, Surgical History, Social History, Family History, Problem List, Medications, and Allergies have been reviewed and updated if relevant.  GEN: No fevers, chills. Nontoxic. Primarily MSK c/o today. MSK: Detailed in the HPI GI: tolerating PO intake without difficulty Neuro: No numbness, parasthesias, or  tingling associated. Otherwise the pertinent positives of the ROS are noted above.   Objective:   BP 126/78 mmHg  Pulse 69  Temp(Src) 97.8 F (36.6 C) (Oral)  Ht 5' 4.25" (1.632 m)  Wt 223 lb (101.152 kg)  BMI 37.98 kg/m2  SpO2 97%   GEN: WDWN, NAD, Non-toxic, Alert & Oriented x 3 HEENT: Atraumatic, Normocephalic.  Ears and Nose: No external deformity. EXTR: No clubbing/cyanosis/edema NEURO: Normal gait.  PSYCH: Normally interactive. Conversant. Not depressed or anxious appearing.  Calm demeanor.    R Knee: left knee with full extension and Flexion to 130.  Right knee with full extension and flexion to 105.  There is a mild effusion.  No tenderness with patellar manipulation.  No significant crepitus.  Patellar facets are nontender.  Medial joint line is markedly tender to palpation throughout its length.  Lateral joint line is tender as well to a lesser degree.  ACL, PCL, MCL, and LCL are all stable.  Lachman is negative.  Bounce home causes pain.  Flexion pinch causes exquisite pain.  McMurray's causes pain but no mechanical symptoms.  Radiology: The radiological images were independently reviewed by myself in the office and results were reviewed with the patient. My independent interpretation of images:  trauma series, nonweightbearing films. On nonweightbearing trauma series, there is some minimal amount of osteoarthritic change.  In the patellofemoral compartment there is superior spur and some small degree of joint space loss, more notable in the patellofemoral  compartment and medial compartments.  There is no fracture or dislocation apparent. Electronically Signed  By: Owens Loffler, MD On: 08/12/2014 8:30 AM   Assessment and Plan:   Right knee pain  Derangement, knee internal, right  At this point, her knee has been feeling pretty good up until a few days ago.  Certainly improved compared to last evaluation.  We discussed various options.  I think she likely does  have a meniscal tear.  At this point the patient would like to be conservative and see how her knee does over the next few months.  If she has issues when she is walking at school, she will follow-up, or if she has issues she can follow-up during one of her breaks.  She has no interest in pursuing any operative intervention at this point.  ADDENDUM, 09/16/2014 The patient has called minimal office again with worsening symptoms. I continue to think the patient likely has a meniscal tear. Bounce home causes pain.  Flexion pinch causes exquisite pain.  McMurray's causes pain but no mechanical symptoms.  Obtain MRI of the right knee to evaluate for internal derangement, meniscal tear, cruciate ligament integrity, or other internal derangement that could be causing recurring symptoms.   Signed,  Maud Deed. Mekisha Bittel, MD   Patient's Medications  New Prescriptions   No medications on file  Previous Medications   ATORVASTATIN (LIPITOR) 10 MG TABLET    Take 1 tablet (10 mg total) by mouth daily.   LEVOTHYROXINE (SYNTHROID, LEVOTHROID) 25 MCG TABLET    Take 1 tablet (25 mcg total) by mouth daily before breakfast.  Modified Medications   No medications on file  Discontinued Medications   No medications on file

## 2014-09-13 ENCOUNTER — Telehealth: Payer: Self-pay | Admitting: Family Medicine

## 2014-09-13 NOTE — Telephone Encounter (Signed)
I am going to forward to Dr Lorelei Pont who last saw her for her knee for his opinion

## 2014-09-13 NOTE — Telephone Encounter (Signed)
Pt would like to have an MRI for her knee and also would like to know if we can find out what her insurance coverage is.  Call back number is [phone].

## 2014-09-15 NOTE — Telephone Encounter (Signed)
This is a very reasonable next step. I am happy to order an MRI to evaluate her knee.  She will need to call her insurance to figure out her responsibility. Most insurers will have a % that the patient is responsible for. She also needs to know what her deductible is if it is completed this year.   Let me know if she wants to proceed.

## 2014-09-16 NOTE — Addendum Note (Signed)
Addended by: Owens Loffler on: 09/16/2014 01:54 PM   Modules accepted: Orders

## 2014-09-16 NOTE — Telephone Encounter (Signed)
done

## 2014-09-16 NOTE — Telephone Encounter (Signed)
Ms. Brodman notified as instructed by telephone. She does want to move forward with MRI.  Will forward message to Dr. Lorelei Pont to order the MRI.

## 2014-09-20 NOTE — Telephone Encounter (Signed)
Handicap Placard completed and placed in Dr. Lillie Fragmin in box for signature.

## 2014-09-20 NOTE — Telephone Encounter (Signed)
Left message for Nicole [name] that her handicap placard application is ready to be picked up at the front desk.

## 2014-09-20 NOTE — Telephone Encounter (Signed)
Pt dropped off handicap placard form for completion. Best number to call patient is [phone] or [phone].

## 2014-09-26 ENCOUNTER — Ambulatory Visit
Admission: RE | Admit: 2014-09-26 | Discharge: 2014-09-26 | Disposition: A | Discharge status: Home / Self Care | Payer: BLUE CROSS/BLUE SHIELD | Source: Ambulatory Visit | Attending: Family Medicine | Admitting: Family Medicine

## 2014-09-26 DIAGNOSIS — X58XXXA Exposure to other specified factors, initial encounter: Secondary | ICD-10-CM | POA: Diagnosis not present

## 2014-09-26 DIAGNOSIS — M25561 Pain in right knee: Secondary | ICD-10-CM

## 2014-09-26 DIAGNOSIS — M25461 Effusion, right knee: Secondary | ICD-10-CM | POA: Insufficient documentation

## 2014-09-26 DIAGNOSIS — S83241A Other tear of medial meniscus, current injury, right knee, initial encounter: Secondary | ICD-10-CM | POA: Insufficient documentation

## 2014-09-26 DIAGNOSIS — M2391 Unspecified internal derangement of right knee: Secondary | ICD-10-CM | POA: Diagnosis present

## 2014-09-29 ENCOUNTER — Other Ambulatory Visit: Payer: Self-pay | Admitting: Family Medicine

## 2014-09-29 DIAGNOSIS — M1711 Unilateral primary osteoarthritis, right knee: Secondary | ICD-10-CM

## 2014-09-29 DIAGNOSIS — S83242A Other tear of medial meniscus, current injury, left knee, initial encounter: Secondary | ICD-10-CM

## 2014-12-25 ENCOUNTER — Ambulatory Visit: Payer: BLUE CROSS/BLUE SHIELD | Admitting: Family Medicine

## 2015-01-06 ENCOUNTER — Ambulatory Visit (INDEPENDENT_AMBULATORY_CARE_PROVIDER_SITE_OTHER): Payer: BLUE CROSS/BLUE SHIELD | Admitting: Family Medicine

## 2015-01-06 ENCOUNTER — Encounter: Payer: Self-pay | Admitting: Family Medicine

## 2015-01-06 VITALS — BP 122/78 | HR 73 | Temp 98.3°F | Ht 64.25 in | Wt 222.0 lb

## 2015-01-06 DIAGNOSIS — E669 Obesity, unspecified: Secondary | ICD-10-CM | POA: Diagnosis not present

## 2015-01-06 DIAGNOSIS — E785 Hyperlipidemia, unspecified: Secondary | ICD-10-CM

## 2015-01-06 DIAGNOSIS — E038 Other specified hypothyroidism: Secondary | ICD-10-CM

## 2015-01-06 LAB — COMPREHENSIVE METABOLIC PANEL
ALBUMIN: 4.3 g/dL (ref 3.5–5.2)
ALK PHOS: 83 U/L (ref 39–117)
ALT: 30 U/L (ref 0–35)
AST: 22 U/L (ref 0–37)
BILIRUBIN TOTAL: 0.6 mg/dL (ref 0.2–1.2)
BUN: 15 mg/dL (ref 6–23)
CALCIUM: 9.4 mg/dL (ref 8.4–10.5)
CO2: 25 mEq/L (ref 19–32)
CREATININE: 0.85 mg/dL (ref 0.40–1.20)
Chloride: 105 mEq/L (ref 96–112)
GFR: 72.51 mL/min (ref >60.00)
Glucose, Bld: 95 mg/dL (ref 70–99)
Potassium: 4 mEq/L (ref 3.5–5.1)
SODIUM: 139 meq/L (ref 135–145)
TOTAL PROTEIN: 7 g/dL (ref 6.0–8.3)

## 2015-01-06 LAB — TSH: TSH: 3.47 u[IU]/mL (ref 0.35–4.50)

## 2015-01-06 LAB — LIPID PANEL
CHOLESTEROL: 161 mg/dL (ref 0–200)
HDL: 51.8 mg/dL (ref >39.00)
LDL Cholesterol: 95 mg/dL (ref 0–99)
NONHDL: 109.57
Total CHOL/HDL Ratio: 3
Triglycerides: 75 mg/dL (ref 0.0–149.0)
VLDL: 15 mg/dL (ref 0.0–40.0)

## 2015-01-06 MED ORDER — ATORVASTATIN CALCIUM 10 MG PO TABS: 10.0000 mg | ORAL_TABLET | Freq: Every day | ORAL | Status: DC

## 2015-01-06 MED ORDER — LEVOTHYROXINE SODIUM 25 MCG PO TABS: 25.0000 ug | ORAL_TABLET | Freq: Every day | ORAL | Status: DC

## 2015-01-06 NOTE — Progress Notes (Signed)
Subjective:    Patient ID: Nicole [name], female    DOB: 06/24/1955, 59 y.o.   MRN: [id number]  HPI Here for f/u of chronic health problems   Wt is down 1 lb with bmi of 37 In obese category She started seeing a nutritionist (who uses a machine to calc body fat and BMI) Working on weight loss  So far it is going well - wt is tracking down on her scale  Starting to track her eating  Exercising - recumbent bike (knee problems)   Will need partial knee replacement  in summer   Hypothyroidism Lab Results  Component Value Date   TSH 4.25 03/18/2014  no symptoms  No wt change/skin or hair or energy change   Hyperlipidemia Lab Results  Component Value Date   CHOL 158 03/18/2014   HDL 52.20 03/18/2014   LDLCALC 76 03/18/2014   LDLDIRECT 157.8 03/23/2012   TRIG 151.0* 03/18/2014   CHOLHDL 3 03/18/2014   On lipitor and diet   Had flu shot at [facility] oct   She got screened for HIV when she got divorced Not high risk for Hep C   Patient Active Problem List   Diagnosis Date Noted  . Knee pain, right 06/14/2014  . Hypothyroid 09/17/2013  . Right foot pain 09/17/2013  . Encounter for routine gynecological examination 03/28/2012  . Routine general medical examination at a health care facility 03/19/2012  . CYST AND PSEUDOCYST OF PANCREAS 12/31/2009  . ABDOMINAL PAIN-RUQ 12/08/2009  . Rash and other nonspecific skin eruption 09/24/2009  . NONSPECIFIC ABN FINDING RAD & OTH EXAM GI TRACT 04/07/2009  . MICROSCOPIC HEMATURIA 03/13/2009  . Hyperlipidemia LDL goal <130 08/02/2007  . FIBROCYSTIC BREAST DISEASE 08/02/2007  . ECZEMA, HANDS 08/02/2007   No past medical history on file. No past surgical history on file. Social History  Substance Use Topics  . Smoking status: Never Smoker   . Smokeless tobacco: Never Used  . Alcohol Use: 0.0 oz/week    0 Standard drinks or equivalent per week     Comment: 1-2 glasses of wine a week   No family history on file. No Known  Allergies Current Outpatient Prescriptions on File Prior to Visit  Medication Sig Dispense Refill  . atorvastatin (LIPITOR) 10 MG tablet Take 1 tablet (10 mg total) by mouth daily. 30 tablet 11  . levothyroxine (SYNTHROID, LEVOTHROID) 25 MCG tablet Take 1 tablet (25 mcg total) by mouth daily before breakfast. 90 tablet 3   No current facility-administered medications on file prior to visit.    Review of Systems Review of Systems  Constitutional: Negative for fever, appetite change, fatigue and unexpected weight change.  Eyes: Negative for pain and visual disturbance.  Respiratory: Negative for cough and shortness of breath.   Cardiovascular: Negative for cp or palpitations    Gastrointestinal: Negative for nausea, diarrhea and constipation.  Genitourinary: Negative for urgency and frequency.  Skin: Negative for pallor or rash   MSK pos for chronic knee pain  Neurological: Negative for weakness, light-headedness, numbness and headaches.  Hematological: Negative for adenopathy. Does not bruise/bleed easily.  Psychiatric/Behavioral: Negative for dysphoric mood. The patient is not nervous/anxious.         Objective:   Physical Exam  Constitutional: She appears well-developed and well-nourished. No distress.  obese and well appearing   HENT:  Head: Normocephalic and atraumatic.  Mouth/Throat: Oropharynx is clear and moist.  Eyes: Conjunctivae and EOM are normal. Pupils are equal, round, and reactive  to light.  Neck: Normal range of motion. Neck supple. No JVD present. Carotid bruit is not present. No thyromegaly present.  Cardiovascular: Normal rate, regular rhythm, normal heart sounds and intact distal pulses.  Exam reveals no gallop.   Pulmonary/Chest: Effort normal and breath sounds normal. No respiratory distress. She has no wheezes. She has no rales.  No crackles  Abdominal: Soft. Bowel sounds are normal. She exhibits no distension, no abdominal bruit and no mass. There is no  tenderness.  Musculoskeletal: She exhibits no edema.  Lymphadenopathy:    She has no cervical adenopathy.  Neurological: She is alert. She has normal reflexes.  Skin: Skin is warm and dry. No rash noted.  Psychiatric: She has a normal mood and affect.          Assessment & Plan:   Problem List Items Addressed This Visit      Endocrine   Hypothyroid - Primary    TSH today and rev dosing No clinical changes except wt loss that is intentional      Relevant Medications   levothyroxine (SYNTHROID, LEVOTHROID) 25 MCG tablet   Other Relevant Orders   TSH (Completed)     Other   Hyperlipidemia LDL goal <130    Lab today for lipid profile in obese pt on statin  Rev low sat fat diet and goals for lipids       Relevant Medications   atorvastatin (LIPITOR) 10 MG tablet   Other Relevant Orders   Comprehensive metabolic panel (Completed)   Lipid panel (Completed)   Obesity    Discussed how this problem influences overall health and the risks it imposes  Reviewed plan for weight loss with lower calorie diet (via better food choices and also portion control or program like weight watchers) and exercise building up to or more than 30 minutes 5 days per week including some aerobic activity   Enc her to keep working with the nutritionist

## 2015-01-06 NOTE — Assessment & Plan Note (Signed)
TSH today and rev dosing No clinical changes except wt loss that is intentional

## 2015-01-06 NOTE — Patient Instructions (Addendum)
Keep up the good work with weight loss/ diet and exercise  Labs today  Take care of yourself

## 2015-01-06 NOTE — Assessment & Plan Note (Signed)
Discussed how this problem influences overall health and the risks it imposes  Reviewed plan for weight loss with lower calorie diet (via better food choices and also portion control or program like weight watchers) and exercise building up to or more than 30 minutes 5 days per week including some aerobic activity   Enc her to keep working with the nutritionist

## 2015-01-06 NOTE — Assessment & Plan Note (Signed)
Lab today for lipid profile in obese pt on statin  Rev low sat fat diet and goals for lipids

## 2015-01-06 NOTE — Progress Notes (Signed)
Pre visit review using our clinic review tool, if applicable. No additional management support is needed unless otherwise documented below in the visit note. 

## 2015-01-13 ENCOUNTER — Telehealth: Payer: Self-pay

## 2015-01-13 NOTE — Telephone Encounter (Signed)
Pt request status of levothyroxine and atorvastatin refills; spoke with sybil at [facility] and rxs ready for pick up . Pt voiced understanding.

## 2015-01-20 ENCOUNTER — Telehealth: Payer: Self-pay | Admitting: Family Medicine

## 2015-01-20 NOTE — Telephone Encounter (Signed)
Pt completed the part that the doc office is suppose to complete, please review, form placed in your inbox

## 2015-01-20 NOTE — Telephone Encounter (Signed)
Pt dropped off health screening ppw to be filled out and signed. Pt asked if you could fax it to [phone] (secured fax). Best number to contact pt is [phone]. Placing ppw in Dr. Marliss Coots rx tower slot.

## 2015-01-20 NOTE — Telephone Encounter (Signed)
Done and in IN box 

## 2015-01-22 NOTE — Telephone Encounter (Signed)
Form faxed and pt notified

## 2015-02-07 ENCOUNTER — Telehealth: Payer: Self-pay | Admitting: Family Medicine

## 2015-02-07 NOTE — Telephone Encounter (Signed)
Filled out and in Copland's inbox for review.

## 2015-02-07 NOTE — Telephone Encounter (Signed)
I am a little bit confused. It looks like I referred her for surgical consultation - we reviewed her MRI findings a number of months ago. Post-operatively, she should not need a disability parking permit.   There are gaps of information that i don't have. Did she decide to not move forward with her knee?

## 2015-02-07 NOTE — Telephone Encounter (Signed)
Pt dropped off an application for her disability parking placard. Her initial card expires 02/2015. You can reach pt at [phone]. Placing ppw in Dr. Lillie Fragmin rx tower spot.

## 2015-02-11 NOTE — Telephone Encounter (Signed)
Spoke with Curt Bears.  She went to see Dr. Sherolyn Buba.  She needs to have a partial knee replacement.  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  Needs handicap placard until then.

## 2015-02-11 NOTE — Telephone Encounter (Signed)
done

## 2015-02-11 NOTE — Telephone Encounter (Signed)
Left message for Nicole [name] to return my call.

## 2015-02-16 HISTORY — PX: MEDIAL PARTIAL KNEE REPLACEMENT: SHX5965

## 2015-03-24 ENCOUNTER — Encounter: Payer: Self-pay | Admitting: Family Medicine

## 2015-03-24 ENCOUNTER — Ambulatory Visit (INDEPENDENT_AMBULATORY_CARE_PROVIDER_SITE_OTHER): Payer: BLUE CROSS/BLUE SHIELD | Admitting: Family Medicine

## 2015-03-24 VITALS — BP 130/78 | HR 77 | Temp 98.3°F | Ht 64.5 in | Wt 217.5 lb

## 2015-03-24 DIAGNOSIS — Z114 Encounter for screening for human immunodeficiency virus [HIV]: Secondary | ICD-10-CM

## 2015-03-24 DIAGNOSIS — E785 Hyperlipidemia, unspecified: Secondary | ICD-10-CM

## 2015-03-24 DIAGNOSIS — E669 Obesity, unspecified: Secondary | ICD-10-CM | POA: Diagnosis not present

## 2015-03-24 DIAGNOSIS — E038 Other specified hypothyroidism: Secondary | ICD-10-CM | POA: Diagnosis not present

## 2015-03-24 DIAGNOSIS — Z1159 Encounter for screening for other viral diseases: Secondary | ICD-10-CM

## 2015-03-24 DIAGNOSIS — Z0000 Encounter for general adult medical examination without abnormal findings: Secondary | ICD-10-CM | POA: Diagnosis not present

## 2015-03-24 LAB — CBC WITH DIFFERENTIAL/PLATELET
BASOS ABS: 0.1 10*3/uL (ref 0.0–0.1)
Basophils Relative: 0.8 % (ref 0.0–3.0)
EOS ABS: 0.5 10*3/uL (ref 0.0–0.7)
Eosinophils Relative: 8.1 % — ABNORMAL HIGH (ref 0.0–5.0)
HCT: 44.7 % (ref 36.0–46.0)
Hemoglobin: 15 g/dL (ref 12.0–15.0)
LYMPHS ABS: 1.8 10*3/uL (ref 0.7–4.0)
LYMPHS PCT: 26.4 % (ref 12.0–46.0)
MCHC: 33.7 g/dL (ref 30.0–36.0)
MCV: 87.5 fl (ref 78.0–100.0)
MONOS PCT: 6.4 % (ref 3.0–12.0)
Monocytes Absolute: 0.4 10*3/uL (ref 0.1–1.0)
NEUTROS ABS: 4 10*3/uL (ref 1.4–7.7)
NEUTROS PCT: 58.3 % (ref 43.0–77.0)
PLATELETS: 236 10*3/uL (ref 150.0–400.0)
RBC: 5.1 Mil/uL (ref 3.87–5.11)
RDW: 13.2 % (ref 11.5–15.5)
WBC: 6.8 10*3/uL (ref 4.0–10.5)

## 2015-03-24 LAB — COMPREHENSIVE METABOLIC PANEL
ALT: 30 U/L (ref 0–35)
AST: 26 U/L (ref 0–37)
Albumin: 4.6 g/dL (ref 3.5–5.2)
Alkaline Phosphatase: 77 U/L (ref 39–117)
BUN: 13 mg/dL (ref 6–23)
CO2: 29 meq/L (ref 19–32)
Calcium: 9.9 mg/dL (ref 8.4–10.5)
Chloride: 105 mEq/L (ref 96–112)
Creatinine, Ser: 0.93 mg/dL (ref 0.40–1.20)
GFR: 65.31 mL/min (ref >60.00)
GLUCOSE: 98 mg/dL (ref 70–99)
Potassium: 4.3 mEq/L (ref 3.5–5.1)
Sodium: 141 mEq/L (ref 135–145)
TOTAL PROTEIN: 7.6 g/dL (ref 6.0–8.3)
Total Bilirubin: 0.7 mg/dL (ref 0.2–1.2)

## 2015-03-24 LAB — LIPID PANEL
CHOLESTEROL: 166 mg/dL (ref 0–200)
HDL: 56.2 mg/dL (ref >39.00)
LDL CALC: 96 mg/dL (ref 0–99)
NonHDL: 109.61
TRIGLYCERIDES: 68 mg/dL (ref 0.0–149.0)
Total CHOL/HDL Ratio: 3
VLDL: 13.6 mg/dL (ref 0.0–40.0)

## 2015-03-24 LAB — TSH: TSH: 3.62 u[IU]/mL (ref 0.35–4.50)

## 2015-03-24 NOTE — Assessment & Plan Note (Signed)
Hep C ab added to labs

## 2015-03-24 NOTE — Patient Instructions (Signed)
Take care of yourself  Labs today  Don't forget to schedule your mammogram  If you are interested in a shingles/zoster vaccine - call your insurance to check on coverage,( you should not get it within 1 month of other vaccines) , then call us for a prescription  for it to take to a pharmacy that gives the shot , or make a nurse visit to get it here depending on your coverage

## 2015-03-24 NOTE — Assessment & Plan Note (Signed)
Lipid panel today Stressed compliance with lipitor Rev low sat fat diet

## 2015-03-24 NOTE — Assessment & Plan Note (Signed)
HIV screen added to labs

## 2015-03-24 NOTE — Assessment & Plan Note (Signed)
TSH today No clinical changes Adj dose as needed

## 2015-03-24 NOTE — Progress Notes (Signed)
Subjective:    Patient ID: Nicole [name], female    DOB: 10-Jul-1955, 60 y.o.   MRN: [id number]  HPI Here for health maintenance exam and to review chronic medical problems    Busy- is planning on building a house  Also scheduled knee surgery   Wt is down 5 lb with bmi of 36 In obese range Started working with a nutritionist through school program  Hard to get it started - restricting calories - very strict on carbs for a month and then moderate from there Portion control is a big change  Also keeping up with exercise at the wellness center at school   Screen for Hep C/HIV-will do today   Zoster vaccine -not interested yet - may consider it later   Mm 11/15 nl - is waiting until march so it gets covered by her insurance  Self breast exam - no lumps   Nl pap 2/14 - normal  No abn paps  Will do it next time   Flu shot 10/16  Td 3/11  Colonoscopy 4/11 -diverticulosis , 10 year recall   Hypothyroidism  Pt has no clinical changes No change in energy level/ hair or skin/ edema and no tremor Lab Results  Component Value Date   TSH 3.47 01/06/2015     Hyperlipidemia Lab Results  Component Value Date   CHOL 161 01/06/2015   HDL 51.80 01/06/2015   LDLCALC 95 01/06/2015   LDLDIRECT 157.8 03/23/2012   TRIG 75.0 01/06/2015   CHOLHDL 3 01/06/2015    Good numbers - eating healthier now    Chemistry      Component Value Date/Time   NA 139 01/06/2015 0905   K 4.0 01/06/2015 0905   CL 105 01/06/2015 0905   CO2 25 01/06/2015 0905   BUN 15 01/06/2015 0905   CREATININE 0.85 01/06/2015 0905      Component Value Date/Time   CALCIUM 9.4 01/06/2015 0905   ALKPHOS 83 01/06/2015 0905   AST 22 01/06/2015 0905   ALT 30 01/06/2015 0905   BILITOT 0.6 01/06/2015 0905       Patient Active Problem List   Diagnosis Date Noted  . Need for hepatitis C screening test 03/24/2015  . Screening for HIV (human immunodeficiency virus) 03/24/2015  . Obesity 01/06/2015  . Knee  pain, right 06/14/2014  . Hypothyroid 09/17/2013  . Right foot pain 09/17/2013  . Encounter for routine gynecological examination 03/28/2012  . Routine general medical examination at a health care facility 03/19/2012  . CYST AND PSEUDOCYST OF PANCREAS 12/31/2009  . ABDOMINAL PAIN-RUQ 12/08/2009  . Rash and other nonspecific skin eruption 09/24/2009  . NONSPECIFIC ABN FINDING RAD & OTH EXAM GI TRACT 04/07/2009  . MICROSCOPIC HEMATURIA 03/13/2009  . Hyperlipidemia LDL goal <130 08/02/2007  . FIBROCYSTIC BREAST DISEASE 08/02/2007  . ECZEMA, HANDS 08/02/2007   No past medical history on file. No past surgical history on file. Social History  Substance Use Topics  . Smoking status: Never Smoker   . Smokeless tobacco: Never Used  . Alcohol Use: 0.0 oz/week    0 Standard drinks or equivalent per week     Comment: 1-2 glasses of wine a week   No family history on file. No Known Allergies Current Outpatient Prescriptions on File Prior to Visit  Medication Sig Dispense Refill  . atorvastatin (LIPITOR) 10 MG tablet Take 1 tablet (10 mg total) by mouth daily. 90 tablet 3  . levothyroxine (SYNTHROID, LEVOTHROID) 25 MCG tablet Take 1  tablet (25 mcg total) by mouth daily before breakfast. 90 tablet 3   No current facility-administered medications on file prior to visit.    Review of Systems Review of Systems  Constitutional: Negative for fever, appetite change, fatigue and unexpected weight change.  Eyes: Negative for pain and visual disturbance.  Respiratory: Negative for cough and shortness of breath.   Cardiovascular: Negative for cp or palpitations    Gastrointestinal: Negative for nausea, diarrhea and constipation.  Genitourinary: Negative for urgency and frequency.  Skin: Negative for pallor or rash   Neurological: Negative for weakness, light-headedness, numbness and headaches.  Hematological: Negative for adenopathy. Does not bruise/bleed easily.  Psychiatric/Behavioral: Negative  for dysphoric mood. The patient is not nervous/anxious.         Objective:   Physical Exam  Constitutional: She appears well-developed and well-nourished. No distress.  obese and well appearing   HENT:  Head: Normocephalic and atraumatic.  Right Ear: External ear normal.  Left Ear: External ear normal.  Mouth/Throat: Oropharynx is clear and moist.  Eyes: Conjunctivae and EOM are normal. Pupils are equal, round, and reactive to light. No scleral icterus.  Neck: Normal range of motion. Neck supple. No JVD present. Carotid bruit is not present. No thyromegaly present.  Cardiovascular: Normal rate, regular rhythm, normal heart sounds and intact distal pulses.  Exam reveals no gallop.   Pulmonary/Chest: Effort normal and breath sounds normal. No respiratory distress. She has no wheezes. She exhibits no tenderness.  Abdominal: Soft. Bowel sounds are normal. She exhibits no distension, no abdominal bruit and no mass. There is no tenderness.  Genitourinary: No breast swelling, tenderness, discharge or bleeding.  Breast exam: No mass, nodules, thickening, tenderness, bulging, retraction, inflamation, nipple discharge or skin changes noted.  No axillary or clavicular LA.      Declined gyn exam today  Musculoskeletal: Normal range of motion. She exhibits no edema or tenderness.  Lymphadenopathy:    She has no cervical adenopathy.  Neurological: She is alert. She has normal reflexes. No cranial nerve deficit. She exhibits normal muscle tone. Coordination normal.  Skin: Skin is warm and dry. No rash noted. No erythema. No pallor.  Lentigo diffusely  Psychiatric: She has a normal mood and affect.          Assessment & Plan:   Problem List Items Addressed This Visit      Endocrine   Hypothyroid - Primary    TSH today No clinical changes Adj dose as needed         Other   Hyperlipidemia LDL goal <130    Lipid panel today Stressed compliance with lipitor Rev low sat fat diet         Need for hepatitis C screening test    Hep C ab added to labs       Relevant Orders   Hepatitis C antibody   Obesity    Discussed how this problem influences overall health and the risks it imposes  Reviewed plan for weight loss with lower calorie diet (via better food choices and also portion control or program like weight watchers) and exercise building up to or more than 30 minutes 5 days per week including some aerobic activity         Routine general medical examination at a health care facility    Reviewed health habits including diet and exercise and skin cancer prevention Reviewed appropriate screening tests for age  Also reviewed health mt list, fam hx and immunization status ,  as well as social and family history   See HPI Labs today Take care of yourself  Labs today  Don't forget to schedule your mammogram  If you are interested in a shingles/zoster vaccine - call your insurance to check on coverage,( you should not get it within 1 month of other vaccines) , then call us for a prescription  for it to take to a pharmacy that gives the shot , or make a nurse visit to get it here depending on your coverage      Relevant Orders   CBC with Differential/Platelet (Completed)   Comprehensive metabolic panel (Completed)   TSH (Completed)   Lipid panel (Completed)   Screening for HIV (human immunodeficiency virus)    HIV screen added to labs       Relevant Orders   HIV antibody (with reflex)

## 2015-03-24 NOTE — Assessment & Plan Note (Signed)
Discussed how this problem influences overall health and the risks it imposes  Reviewed plan for weight loss with lower calorie diet (via better food choices and also portion control or program like weight watchers) and exercise building up to or more than 30 minutes 5 days per week including some aerobic activity    

## 2015-03-24 NOTE — Assessment & Plan Note (Signed)
Reviewed health habits including diet and exercise and skin cancer prevention Reviewed appropriate screening tests for age  Also reviewed health mt list, fam hx and immunization status , as well as social and family history   See HPI Labs today Take care of yourself  Labs today  Don't forget to schedule your mammogram  If you are interested in a shingles/zoster vaccine - call your insurance to check on coverage,( you should not get it within 1 month of other vaccines) , then call us for a prescription  for it to take to a pharmacy that gives the shot , or make a nurse visit to get it here depending on your coverage

## 2015-03-24 NOTE — Progress Notes (Signed)
Pre visit review using our clinic review tool, if applicable. No additional management support is needed unless otherwise documented below in the visit note. 

## 2015-03-25 LAB — HEPATITIS C ANTIBODY: HCV AB: NEGATIVE

## 2015-03-25 LAB — HIV ANTIBODY (ROUTINE TESTING W REFLEX): HIV 1&2 Ab, 4th Generation: NONREACTIVE

## 2015-03-31 ENCOUNTER — Other Ambulatory Visit: Payer: Self-pay

## 2015-03-31 DIAGNOSIS — Z1231 Encounter for screening mammogram for malignant neoplasm of breast: Secondary | ICD-10-CM

## 2015-04-21 ENCOUNTER — Encounter: Payer: BLUE CROSS/BLUE SHIELD | Admitting: Family Medicine

## 2015-04-23 ENCOUNTER — Ambulatory Visit
Admission: RE | Admit: 2015-04-23 | Discharge: 2015-04-23 | Disposition: A | Discharge status: Home / Self Care | Payer: BLUE CROSS/BLUE SHIELD | Source: Ambulatory Visit

## 2015-04-23 DIAGNOSIS — Z1231 Encounter for screening mammogram for malignant neoplasm of breast: Secondary | ICD-10-CM

## 2015-04-25 ENCOUNTER — Telehealth: Payer: Self-pay | Admitting: Family Medicine

## 2015-04-25 MED ORDER — ZOSTER VACCINE LIVE 19400 UNT/0.65ML ~~LOC~~ SOLR: 0.6500 mL | Freq: Once | SUBCUTANEOUS | Status: DC

## 2015-04-25 MED ORDER — PNEUMOCOCCAL VAC POLYVALENT 25 MCG/0.5ML IJ INJ: 0.5000 mL | INJECTION | Freq: Once | INTRAMUSCULAR | Status: DC

## 2015-04-25 NOTE — Telephone Encounter (Signed)
I sent them   

## 2015-04-25 NOTE — Telephone Encounter (Signed)
Pt called wanting to get a rx sent to [facility] for the shingle shot and pneumonia shot Please advise when this has been called

## 2015-04-25 NOTE — Telephone Encounter (Signed)
Pharmacy said pt does only need the ppsv 23 due to age, pt notified Rxs sent

## 2015-04-25 NOTE — Telephone Encounter (Signed)
I thought we did not start giving prevnar until age 60- she is 26 correct? Am I wrong?

## 2015-04-25 NOTE — Telephone Encounter (Signed)
Cara with [facility] left v/m; wanting to know if pt was to have prevnar 13 or pneumovax, usually  Gets prevnar 13 before the pneumovax. Cara request cb.

## 2015-12-25 ENCOUNTER — Other Ambulatory Visit: Payer: Self-pay | Admitting: Family Medicine

## 2016-03-24 ENCOUNTER — Ambulatory Visit (INDEPENDENT_AMBULATORY_CARE_PROVIDER_SITE_OTHER): Payer: BLUE CROSS/BLUE SHIELD | Admitting: Family Medicine

## 2016-03-24 ENCOUNTER — Encounter: Payer: Self-pay | Admitting: Family Medicine

## 2016-03-24 ENCOUNTER — Other Ambulatory Visit (HOSPITAL_COMMUNITY)
Admission: RE | Admit: 2016-03-24 | Discharge: 2016-03-24 | Disposition: A | Discharge status: Home / Self Care | Payer: BLUE CROSS/BLUE SHIELD | Source: Ambulatory Visit | Attending: Family Medicine | Admitting: Family Medicine

## 2016-03-24 VITALS — BP 118/76 | HR 62 | Temp 97.8°F | Ht 64.25 in | Wt 223.2 lb

## 2016-03-24 DIAGNOSIS — E785 Hyperlipidemia, unspecified: Secondary | ICD-10-CM | POA: Diagnosis not present

## 2016-03-24 DIAGNOSIS — Z01419 Encounter for gynecological examination (general) (routine) without abnormal findings: Secondary | ICD-10-CM

## 2016-03-24 DIAGNOSIS — Z1151 Encounter for screening for human papillomavirus (HPV): Secondary | ICD-10-CM | POA: Diagnosis not present

## 2016-03-24 DIAGNOSIS — Z6838 Body mass index (BMI) 38.0-38.9, adult: Secondary | ICD-10-CM | POA: Diagnosis not present

## 2016-03-24 DIAGNOSIS — Z0000 Encounter for general adult medical examination without abnormal findings: Secondary | ICD-10-CM

## 2016-03-24 DIAGNOSIS — E038 Other specified hypothyroidism: Secondary | ICD-10-CM

## 2016-03-24 DIAGNOSIS — E6609 Other obesity due to excess calories: Secondary | ICD-10-CM

## 2016-03-24 LAB — CBC WITH DIFFERENTIAL/PLATELET
BASOS PCT: 1 % (ref 0.0–3.0)
Basophils Absolute: 0.1 10*3/uL (ref 0.0–0.1)
EOS PCT: 4.4 % (ref 0.0–5.0)
Eosinophils Absolute: 0.3 10*3/uL (ref 0.0–0.7)
HEMATOCRIT: 43.8 % (ref 36.0–46.0)
HEMOGLOBIN: 14.8 g/dL (ref 12.0–15.0)
LYMPHS PCT: 27.2 % (ref 12.0–46.0)
Lymphs Abs: 1.9 10*3/uL (ref 0.7–4.0)
MCHC: 33.8 g/dL (ref 30.0–36.0)
MCV: 88 fl (ref 78.0–100.0)
MONO ABS: 0.4 10*3/uL (ref 0.1–1.0)
Monocytes Relative: 6.4 % (ref 3.0–12.0)
Neutro Abs: 4.3 10*3/uL (ref 1.4–7.7)
Neutrophils Relative %: 61 % (ref 43.0–77.0)
Platelets: 237 10*3/uL (ref 150.0–400.0)
RBC: 4.98 Mil/uL (ref 3.87–5.11)
RDW: 13.8 % (ref 11.5–15.5)
WBC: 7 10*3/uL (ref 4.0–10.5)

## 2016-03-24 LAB — COMPREHENSIVE METABOLIC PANEL
ALBUMIN: 4.7 g/dL (ref 3.5–5.2)
ALT: 29 U/L (ref 0–35)
AST: 24 U/L (ref 0–37)
Alkaline Phosphatase: 83 U/L (ref 39–117)
BUN: 13 mg/dL (ref 6–23)
CALCIUM: 9.5 mg/dL (ref 8.4–10.5)
CHLORIDE: 104 meq/L (ref 96–112)
CO2: 28 mEq/L (ref 19–32)
Creatinine, Ser: 0.84 mg/dL (ref 0.40–1.20)
GFR: 73.2 mL/min (ref >60.00)
Glucose, Bld: 98 mg/dL (ref 70–99)
Potassium: 3.9 mEq/L (ref 3.5–5.1)
Sodium: 138 mEq/L (ref 135–145)
Total Bilirubin: 0.8 mg/dL (ref 0.2–1.2)
Total Protein: 7.9 g/dL (ref 6.0–8.3)

## 2016-03-24 LAB — LIPID PANEL
CHOLESTEROL: 168 mg/dL (ref 0–200)
HDL: 53.5 mg/dL (ref >39.00)
LDL Cholesterol: 94 mg/dL (ref 0–99)
NONHDL: 114.93
Total CHOL/HDL Ratio: 3
Triglycerides: 107 mg/dL (ref 0.0–149.0)
VLDL: 21.4 mg/dL (ref 0.0–40.0)

## 2016-03-24 LAB — TSH: TSH: 3.79 u[IU]/mL (ref 0.35–4.50)

## 2016-03-24 NOTE — Assessment & Plan Note (Signed)
Reviewed health habits including diet and exercise and skin cancer prevention Reviewed appropriate screening tests for age  Also reviewed health mt list, fam hx and immunization status , as well as social and family history   See HPI Labs drawn today  Wt loss enc with diet and exercise Gyn exam She will schedule own mammogram utd imms and other screening

## 2016-03-24 NOTE — Progress Notes (Signed)
Pre visit review using our clinic review tool, if applicable. No additional management support is needed unless otherwise documented below in the visit note. 

## 2016-03-24 NOTE — Assessment & Plan Note (Signed)
Routine nl exam with 3 y pap No symptoms

## 2016-03-24 NOTE — Patient Instructions (Addendum)
You are due for a mammogram in march - she will schedule it herself- has the letter  When your schedule gets more reliable-put together a regular exercise program -aiming for 5 days per week 30 or more minutes  Labs today  Pap done today

## 2016-03-24 NOTE — Progress Notes (Signed)
Subjective:    Patient ID: Nicole [name], female    DOB: 07/25/1955, 61 y.o.   MRN: [id number]  HPI Here for health maintenance exam and to review chronic medical problems    Busy- in between houses and building one- moving in little by little  Had a knee replacement    Wt Readings from Last 3 Encounters:  03/24/16 223 lb 4 oz (101.3 kg)  03/24/15 217 lb 8 oz (98.7 kg)  01/06/15 222 lb (100.7 kg)  taking care of herself  Exercise not as consistent with moving - but that will change - doing PT for knee  Really happy with her knee replacement  Is eating a healthy diet (careful with her diverticulosis)- challenging with the move  bmi 38.0  Hep C/ HIV screening neg 2/17  Mammogram 3/17 nl -will schedule that in march  Self breast exam -no lumps   Pap/gyn care  Pap 2/14 nl - due for 3 year pap  No symptoms or new partners   Zoster vaccine 2/17 Flu vaccine 10/17 Tetanus vaccine 3/11  Colonoscopy/ screening  Colonoscopy 4/11 - nl (tics) with 10 year recall   Family hx:  Mother had uterine cancer , alcoholism  Father died young from [place] (heavy smoker and drinker)     Hypothyroidism  Pt has no clinical changes No change in energy level/ hair or skin/ edema and no tremor Lab Results  Component Value Date   TSH 3.62 03/24/2015    Due for labs    Hx of hyperlipidemia Lab Results  Component Value Date   CHOL 166 03/24/2015   CHOL 161 01/06/2015   CHOL 158 03/18/2014   Lab Results  Component Value Date   HDL 56.20 03/24/2015   HDL 51.80 01/06/2015   HDL 52.20 03/18/2014   Lab Results  Component Value Date   LDLCALC 96 03/24/2015   [place] 95 01/06/2015   LDLCALC 76 03/18/2014   Lab Results  Component Value Date   TRIG 68.0 03/24/2015   TRIG 75.0 01/06/2015   TRIG 151.0 (H) 03/18/2014   Lab Results  Component Value Date   CHOLHDL 3 03/24/2015   CHOLHDL 3 01/06/2015   CHOLHDL 3 03/18/2014   Lab Results  Component Value Date   LDLDIRECT  157.8 03/23/2012   LDLDIRECT 154.9 11/27/2009   LDLDIRECT 178.7 08/14/2009     Due for labs today   Patient Active Problem List   Diagnosis Date Noted  . Obesity 01/06/2015  . Hypothyroid 09/17/2013  . Right foot pain 09/17/2013  . Encounter for routine gynecological examination 03/28/2012  . Routine general medical examination at a health care facility 03/19/2012  . CYST AND PSEUDOCYST OF PANCREAS 12/31/2009  . Rash and other nonspecific skin eruption 09/24/2009  . NONSPECIFIC ABN FINDING RAD & OTH EXAM GI TRACT 04/07/2009  . Hyperlipidemia LDL goal <130 08/02/2007  . ECZEMA, HANDS 08/02/2007   No past medical history on file. No past surgical history on file. Social History  Substance Use Topics  . Smoking status: Never Smoker  . Smokeless tobacco: Never Used  . Alcohol use 0.0 oz/week     Comment: 1-2 glasses of wine a week   Family History  Problem Relation Age of Onset  . Uterine cancer Mother   . Alcoholism Mother   . Lymphoma Father    No Known Allergies Current Outpatient Prescriptions on File Prior to Visit  Medication Sig Dispense Refill  . atorvastatin (LIPITOR) 10 MG tablet TAKE ONE TABLET  BY MOUTH ONCE DAILY 90 tablet 1  . levothyroxine (SYNTHROID, LEVOTHROID) 25 MCG tablet TAKE ONE TABLET BY MOUTH ONCE DAILY BEFORE BREAKFAST 90 tablet 1   No current facility-administered medications on file prior to visit.      Review of Systems Review of Systems  Constitutional: Negative for fever, appetite change, fatigue and unexpected weight change.  Eyes: Negative for pain and visual disturbance.  Respiratory: Negative for cough and shortness of breath.   Cardiovascular: Negative for cp or palpitations    Gastrointestinal: Negative for nausea, diarrhea and constipation.  Genitourinary: Negative for urgency and frequency.  Skin: Negative for pallor or rash   Neurological: Negative for weakness, light-headedness, numbness and headaches.  Hematological: Negative  for adenopathy. Does not bruise/bleed easily.  Psychiatric/Behavioral: Negative for dysphoric mood. The patient is not nervous/anxious.         Objective:   Physical Exam  Constitutional: She appears well-developed and well-nourished. No distress.  obese and well appearing   HENT:  Head: Normocephalic and atraumatic.  Right Ear: External ear normal.  Left Ear: External ear normal.  Mouth/Throat: Oropharynx is clear and moist.  Eyes: Conjunctivae and EOM are normal. Pupils are equal, round, and reactive to light. No scleral icterus.  Neck: Normal range of motion. Neck supple. No JVD present. Carotid bruit is not present. No thyromegaly present.  Cardiovascular: Normal rate, regular rhythm, normal heart sounds and intact distal pulses.  Exam reveals no gallop.   Pulmonary/Chest: Effort normal and breath sounds normal. No respiratory distress. She has no wheezes. She exhibits no tenderness.  Abdominal: Soft. Bowel sounds are normal. She exhibits no distension, no abdominal bruit and no mass. There is no tenderness.  Genitourinary: No breast swelling, tenderness, discharge or bleeding.  Genitourinary Comments: Breast exam: No mass, nodules, thickening, tenderness, bulging, retraction, inflamation, nipple discharge or skin changes noted.  No axillary or clavicular LA.             Anus appears normal w/o hemorrhoids or masses     External genitalia : nl appearance and hair distribution/no lesions     Urethral meatus : nl size, no lesions or prolapse     Urethra: no masses, tenderness or scarring    Bladder : no masses or tenderness     Vagina: nl general appearance, no discharge or  Lesions, no significant cystocele  or rectocele     Cervix: no lesions/ discharge or friability    Uterus: nl size, contour, position, and mobility (not fixed) , non tender    Adnexa : no masses, tenderness, enlargement or nodularity        Musculoskeletal: Normal range of motion. She  exhibits no edema or tenderness.  Well healed scar on knee  Lymphadenopathy:    She has no cervical adenopathy.  Neurological: She is alert. She has normal reflexes. No cranial nerve deficit. She exhibits normal muscle tone. Coordination normal.  Skin: Skin is warm and dry. No rash noted. No erythema. No pallor.  Psychiatric: She has a normal mood and affect.          Assessment & Plan:   Problem List Items Addressed This Visit      Endocrine   Hypothyroid    TSH today  No clinical changes except wt gain         Other   Encounter for routine gynecological examination    Routine nl exam with 3 y pap No symptoms      Relevant Orders  Cytology - PAP   Hyperlipidemia LDL goal <130    Lab today Disc goals for lipids and reasons to control them Rev labs with pt  (from last draw) Rev low sat fat diet in detail       Obesity    Discussed how this problem influences overall health and the risks it imposes  Reviewed plan for weight loss with lower calorie diet (via better food choices and also portion control or program like weight watchers) and exercise building up to or more than 30 minutes 5 days per week including some aerobic activity         Routine general medical examination at a health care facility - Primary    Reviewed health habits including diet and exercise and skin cancer prevention Reviewed appropriate screening tests for age  Also reviewed health mt list, fam hx and immunization status , as well as social and family history   See HPI Labs drawn today  Wt loss enc with diet and exercise Gyn exam She will schedule own mammogram utd imms and other screening       Relevant Orders   CBC with Differential/Platelet (Completed)   Comprehensive metabolic panel (Completed)   Lipid panel (Completed)   TSH (Completed)

## 2016-03-24 NOTE — Assessment & Plan Note (Signed)
Lab today Disc goals for lipids and reasons to control them Rev labs with pt  (from last draw) Rev low sat fat diet in detail

## 2016-03-24 NOTE — Assessment & Plan Note (Signed)
TSH today  No clinical changes except wt gain

## 2016-03-24 NOTE — Assessment & Plan Note (Signed)
Discussed how this problem influences overall health and the risks it imposes  Reviewed plan for weight loss with lower calorie diet (via better food choices and also portion control or program like weight watchers) and exercise building up to or more than 30 minutes 5 days per week including some aerobic activity    

## 2016-03-26 LAB — CYTOLOGY - PAP
Diagnosis: NEGATIVE
HPV (WINDOPATH): NOT DETECTED

## 2016-04-12 ENCOUNTER — Other Ambulatory Visit: Payer: Self-pay | Admitting: Family Medicine

## 2016-04-12 DIAGNOSIS — Z1231 Encounter for screening mammogram for malignant neoplasm of breast: Secondary | ICD-10-CM

## 2016-04-23 ENCOUNTER — Ambulatory Visit
Admission: RE | Admit: 2016-04-23 | Discharge: 2016-04-23 | Disposition: A | Discharge status: Home / Self Care | Payer: BLUE CROSS/BLUE SHIELD | Source: Ambulatory Visit | Attending: Family Medicine | Admitting: Family Medicine

## 2016-04-23 DIAGNOSIS — Z1231 Encounter for screening mammogram for malignant neoplasm of breast: Secondary | ICD-10-CM

## 2016-06-23 ENCOUNTER — Other Ambulatory Visit: Payer: Self-pay | Admitting: Family Medicine

## 2017-03-11 ENCOUNTER — Other Ambulatory Visit: Payer: Self-pay | Admitting: Family Medicine

## 2017-03-11 DIAGNOSIS — Z139 Encounter for screening, unspecified: Secondary | ICD-10-CM

## 2017-03-20 ENCOUNTER — Telehealth: Payer: Self-pay | Admitting: Family Medicine

## 2017-03-20 DIAGNOSIS — Z0000 Encounter for general adult medical examination without abnormal findings: Secondary | ICD-10-CM

## 2017-03-20 DIAGNOSIS — E785 Hyperlipidemia, unspecified: Secondary | ICD-10-CM

## 2017-03-20 DIAGNOSIS — E038 Other specified hypothyroidism: Secondary | ICD-10-CM

## 2017-03-20 NOTE — Telephone Encounter (Signed)
-----   Message from Ellamae Sia sent at 03/16/2017 10:32 AM EST ----- Regarding: Lab orders for Thursday, 2.7.19 Patient is scheduled for CPX labs, please order future labs, Thanks , Karna Christmas

## 2017-03-24 ENCOUNTER — Other Ambulatory Visit (INDEPENDENT_AMBULATORY_CARE_PROVIDER_SITE_OTHER): Payer: BLUE CROSS/BLUE SHIELD

## 2017-03-24 DIAGNOSIS — Z0000 Encounter for general adult medical examination without abnormal findings: Secondary | ICD-10-CM | POA: Diagnosis not present

## 2017-03-24 DIAGNOSIS — E785 Hyperlipidemia, unspecified: Secondary | ICD-10-CM

## 2017-03-24 DIAGNOSIS — E038 Other specified hypothyroidism: Secondary | ICD-10-CM

## 2017-03-24 LAB — COMPREHENSIVE METABOLIC PANEL
ALBUMIN: 4.1 g/dL (ref 3.5–5.2)
ALK PHOS: 76 U/L (ref 39–117)
ALT: 24 U/L (ref 0–35)
AST: 20 U/L (ref 0–37)
BILIRUBIN TOTAL: 0.8 mg/dL (ref 0.2–1.2)
BUN: 17 mg/dL (ref 6–23)
CO2: 29 mEq/L (ref 19–32)
CREATININE: 0.94 mg/dL (ref 0.40–1.20)
Calcium: 9.4 mg/dL (ref 8.4–10.5)
Chloride: 105 mEq/L (ref 96–112)
GFR: 64.08 mL/min (ref >60.00)
Glucose, Bld: 92 mg/dL (ref 70–99)
POTASSIUM: 4 meq/L (ref 3.5–5.1)
Sodium: 140 mEq/L (ref 135–145)
TOTAL PROTEIN: 7.2 g/dL (ref 6.0–8.3)

## 2017-03-24 LAB — LIPID PANEL
CHOLESTEROL: 155 mg/dL (ref 0–200)
HDL: 46.5 mg/dL (ref >39.00)
LDL Cholesterol: 91 mg/dL (ref 0–99)
NonHDL: 108.58
Total CHOL/HDL Ratio: 3
Triglycerides: 88 mg/dL (ref 0.0–149.0)
VLDL: 17.6 mg/dL (ref 0.0–40.0)

## 2017-03-24 LAB — CBC WITH DIFFERENTIAL/PLATELET
BASOS ABS: 0.1 10*3/uL (ref 0.0–0.1)
Basophils Relative: 1.7 % (ref 0.0–3.0)
EOS ABS: 0.5 10*3/uL (ref 0.0–0.7)
Eosinophils Relative: 8.1 % — ABNORMAL HIGH (ref 0.0–5.0)
HCT: 41.9 % (ref 36.0–46.0)
HEMOGLOBIN: 14.2 g/dL (ref 12.0–15.0)
Lymphocytes Relative: 30.9 % (ref 12.0–46.0)
Lymphs Abs: 2 10*3/uL (ref 0.7–4.0)
MCHC: 33.9 g/dL (ref 30.0–36.0)
MCV: 87.3 fl (ref 78.0–100.0)
MONO ABS: 0.5 10*3/uL (ref 0.1–1.0)
Monocytes Relative: 8.2 % (ref 3.0–12.0)
NEUTROS PCT: 51.1 % (ref 43.0–77.0)
Neutro Abs: 3.3 10*3/uL (ref 1.4–7.7)
Platelets: 219 10*3/uL (ref 150.0–400.0)
RBC: 4.8 Mil/uL (ref 3.87–5.11)
RDW: 14 % (ref 11.5–15.5)
WBC: 6.4 10*3/uL (ref 4.0–10.5)

## 2017-03-24 LAB — TSH: TSH: 6.94 u[IU]/mL — AB (ref 0.35–4.50)

## 2017-03-28 ENCOUNTER — Ambulatory Visit (INDEPENDENT_AMBULATORY_CARE_PROVIDER_SITE_OTHER): Payer: BLUE CROSS/BLUE SHIELD | Admitting: Family Medicine

## 2017-03-28 ENCOUNTER — Encounter: Payer: Self-pay | Admitting: Family Medicine

## 2017-03-28 VITALS — BP 122/70 | HR 77 | Temp 98.0°F | Resp 16 | Ht 64.0 in | Wt 224.2 lb

## 2017-03-28 DIAGNOSIS — Z0000 Encounter for general adult medical examination without abnormal findings: Secondary | ICD-10-CM

## 2017-03-28 DIAGNOSIS — E038 Other specified hypothyroidism: Secondary | ICD-10-CM

## 2017-03-28 DIAGNOSIS — Z6838 Body mass index (BMI) 38.0-38.9, adult: Secondary | ICD-10-CM | POA: Diagnosis not present

## 2017-03-28 DIAGNOSIS — E785 Hyperlipidemia, unspecified: Secondary | ICD-10-CM | POA: Diagnosis not present

## 2017-03-28 DIAGNOSIS — E6609 Other obesity due to excess calories: Secondary | ICD-10-CM

## 2017-03-28 MED ORDER — LEVOTHYROXINE SODIUM 50 MCG PO TABS: 50.0000 ug | ORAL_TABLET | Freq: Every day | ORAL | 11 refills | Status: DC

## 2017-03-28 MED ORDER — ATORVASTATIN CALCIUM 10 MG PO TABS: 10.0000 mg | ORAL_TABLET | Freq: Every day | ORAL | 3 refills | Status: DC

## 2017-03-28 NOTE — Assessment & Plan Note (Signed)
Discussed how this problem influences overall health and the risks it imposes  Reviewed plan for weight loss with lower calorie diet (via better food choices and also portion control or program like weight watchers) and exercise building up to or more than 30 minutes 5 days per week including some aerobic activity    

## 2017-03-28 NOTE — Patient Instructions (Addendum)
For weight loss and general health  Try to get most of your carbohydrates from produce (with the exception of white potatoes)  Eat less bread/pasta/rice/snack foods/cereals/sweets and other items from the middle of the grocery store (processed carbs)   Due for a mammogram in march-get it as scheduled   Try to get 1200-1500 mg of calcium per day with at least 1000 iu of vitamin D - for bone health  Do not take the calcium within 3 hours of the thyroid medicine   We will increase thyroid dose to 50 mcg daily  Lab in 6 weeks

## 2017-03-28 NOTE — Progress Notes (Signed)
Subjective:    Patient ID: Nicole [name], female    DOB: 28-Dec-1955, 62 y.o.   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) -which is good  Would like to add something more for strength training  38.49 kg/m   Mammogram 3/18-normal -has next one scheduled for next mo  Self breast exam -no lumps   Pap 2/18 neg with neg HPV   Tetanus shot 3/11  Colonoscopy 4/11 nl with 10 y recall   Flu shot 10/18   zostavax 2/17  Hypothyroidism  Pt has no clinical changes No change in energy level/ hair or skin/ edema and no tremor Lab Results  Component Value Date   TSH 6.94 (H) 03/24/2017    Takes it first thing in the am  Will need to incr dose    Cholesterol  Lab Results  Component Value Date   CHOL 155 03/24/2017   CHOL 168 03/24/2016   CHOL 166 03/24/2015   Lab Results  Component Value Date   HDL 46.50 03/24/2017   HDL 53.50 03/24/2016   HDL 56.20 03/24/2015   Lab Results  Component Value Date   LDLCALC 91 03/24/2017   LDLCALC 94 03/24/2016   LDLCALC 96 03/24/2015   Lab Results  Component Value Date   TRIG 88.0 03/24/2017   TRIG 107.0 03/24/2016   TRIG 68.0 03/24/2015   Lab Results  Component Value Date   CHOLHDL 3 03/24/2017   CHOLHDL 3 03/24/2016   CHOLHDL 3 03/24/2015   Lab Results  Component Value Date   LDLDIRECT 157.8 03/23/2012   LDLDIRECT 154.9 11/27/2009   LDLDIRECT 178.7 08/14/2009  on atorvastatin and diet    BP Readings from Last 3 Encounters:  03/28/17 122/70  03/24/16 118/76  03/24/15  130/78   Lab Results  Component Value Date   CREATININE 0.94 03/24/2017   BUN 17 03/24/2017   NA 140 03/24/2017   K 4.0 03/24/2017   CL 105 03/24/2017   CO2 29 03/24/2017   Lab Results  Component Value Date   ALT 24 03/24/2017   AST 20 03/24/2017   ALKPHOS 76 03/24/2017   BILITOT 0.8 03/24/2017   Lab Results  Component Value Date   WBC 6.4 03/24/2017   HGB 14.2 03/24/2017   HCT 41.9 03/24/2017   MCV 87.3 03/24/2017   PLT 219.0 03/24/2017    Patient Active Problem List   Diagnosis Date Noted  . Obesity 01/06/2015  . Hypothyroid 09/17/2013  . Right foot pain 09/17/2013  . Encounter for routine gynecological examination 03/28/2012  . Routine general medical examination at a health care facility 03/19/2012  . CYST AND PSEUDOCYST OF PANCREAS 12/31/2009  . Rash and other nonspecific skin eruption 09/24/2009  . NONSPECIFIC ABN FINDING RAD & OTH EXAM GI TRACT 04/07/2009  . Hyperlipidemia LDL goal <130 08/02/2007  . ECZEMA, HANDS 08/02/2007   No past medical history on file. No past surgical history on file. Social History  Tobacco Use  . Smoking status: Never Smoker  . Smokeless tobacco: Never Used  Substance Use Topics  . Alcohol use: Yes    Alcohol/week: 0.0 oz    Comment: 1-2 glasses of wine a week  . Drug use: No   Family History  Problem Relation Age of Onset  . Uterine cancer Mother   . Alcoholism Mother   . Lymphoma Father    No Known Allergies No current outpatient medications on file prior to visit.   No current facility-administered medications on file prior to visit.     Review of Systems  Constitutional: Negative for activity change, appetite change, fatigue, fever and unexpected weight change.  HENT: Negative for congestion, ear pain, rhinorrhea, sinus pressure and sore throat.   Eyes: Negative for pain, redness and visual disturbance.  Respiratory: Negative for cough, shortness of breath and wheezing.   Cardiovascular: Negative for chest  pain and palpitations.  Gastrointestinal: Negative for abdominal pain, blood in stool, constipation and diarrhea.  Endocrine: Negative for polydipsia and polyuria.  Genitourinary: Negative for dysuria, frequency and urgency.  Musculoskeletal: Negative for arthralgias, back pain and myalgias.  Skin: Negative for pallor and rash.  Allergic/Immunologic: Negative for environmental allergies.  Neurological: Negative for dizziness, syncope and headaches.  Hematological: Negative for adenopathy. Does not bruise/bleed easily.  Psychiatric/Behavioral: Negative for decreased concentration and dysphoric mood. The patient is not nervous/anxious.        Objective:   Physical Exam  Constitutional: She appears well-developed and well-nourished. No distress.  obese and well appearing   HENT:  Head: Normocephalic and atraumatic.  Right Ear: External ear normal.  Left Ear: External ear normal.  Mouth/Throat: Oropharynx is clear and moist.  Eyes: Conjunctivae and EOM are normal. Pupils are equal, round, and reactive to light. No scleral icterus.  Neck: Normal range of motion. Neck supple. No JVD present. Carotid bruit is not present. No thyromegaly present.  Cardiovascular: Normal rate, regular rhythm, normal heart sounds and intact distal pulses. Exam reveals no gallop.  Pulmonary/Chest: Effort normal and breath sounds normal. No respiratory distress. She has no wheezes. She exhibits no tenderness.  Abdominal: Soft. Bowel sounds are normal. She exhibits no distension, no abdominal bruit and no mass. There is no tenderness.  Genitourinary: No breast swelling, tenderness, discharge or bleeding.  Genitourinary Comments: Breast exam: No mass, nodules, thickening, tenderness, bulging, retraction, inflamation, nipple discharge or skin changes noted.  No axillary or clavicular LA.      Musculoskeletal: Normal range of motion. She exhibits no edema or tenderness.  Lymphadenopathy:    She has no cervical  adenopathy.  Neurological: She is alert. She has normal reflexes. She displays no tremor. No cranial nerve deficit. She exhibits normal muscle tone. Coordination normal.  Skin: Skin is warm and dry. No rash noted. No erythema. No pallor.  Solar lentigines diffusely   Psychiatric: She has a normal mood and affect.          Assessment & Plan:   Problem List Items Addressed This Visit      Endocrine   Hypothyroid    Lab Results  Component Value Date   TSH 6.94 (H) 03/24/2017   Will inc levothy from 25 to 50 mcg daily  Re check TSH in 6 weeks       Relevant Medications   levothyroxine (SYNTHROID, LEVOTHROID) 50 MCG tablet     Other   Hyperlipidemia LDL goal <130    Disc goals for lipids and reasons to control them  Rev labs with pt Rev low sat fat diet in detail Well controlled with atorvastatin and diet       Relevant Medications   atorvastatin (LIPITOR) 10 MG tablet   Obesity    Discussed how this problem influences overall health and the risks it imposes  Reviewed plan for weight loss with lower calorie diet (via better food choices and also portion control or program like weight watchers) and exercise building up to or more than 30 minutes 5 days per week including some aerobic activity         Routine general medical examination at a health care facility - Primary    Reviewed health habits including diet and exercise and skin cancer prevention Reviewed appropriate screening tests for age  Also reviewed health mt list, fam hx and immunization status , as well as social and family history   See HPI Wt loss enc as well as addn of more exercise March mammogram is scheduled Enc to take ca and D for bone health  Labs reviewed

## 2017-03-28 NOTE — Assessment & Plan Note (Signed)
Reviewed health habits including diet and exercise and skin cancer prevention Reviewed appropriate screening tests for age  Also reviewed health mt list, fam hx and immunization status , as well as social and family history   See HPI Wt loss enc as well as addn of more exercise March mammogram is scheduled Enc to take ca and D for bone health  Labs reviewed

## 2017-03-28 NOTE — Assessment & Plan Note (Signed)
Disc goals for lipids and reasons to control them Rev labs with pt Rev low sat fat diet in detail Well controlled with atorvastatin and diet

## 2017-03-28 NOTE — Assessment & Plan Note (Signed)
Lab Results  Component Value Date   TSH 6.94 (H) 03/24/2017   Will inc levothy from 25 to 50 mcg daily  Re check TSH in 6 weeks

## 2017-04-25 ENCOUNTER — Ambulatory Visit
Admission: RE | Admit: 2017-04-25 | Discharge: 2017-04-25 | Disposition: A | Discharge status: Home / Self Care | Payer: BLUE CROSS/BLUE SHIELD | Source: Ambulatory Visit | Attending: Family Medicine | Admitting: Family Medicine

## 2017-04-25 DIAGNOSIS — Z139 Encounter for screening, unspecified: Secondary | ICD-10-CM

## 2017-05-11 ENCOUNTER — Other Ambulatory Visit (INDEPENDENT_AMBULATORY_CARE_PROVIDER_SITE_OTHER): Payer: BLUE CROSS/BLUE SHIELD

## 2017-05-11 ENCOUNTER — Telehealth: Payer: Self-pay | Admitting: Family Medicine

## 2017-05-11 DIAGNOSIS — E038 Other specified hypothyroidism: Secondary | ICD-10-CM

## 2017-05-11 LAB — TSH: TSH: 2.5 u[IU]/mL (ref 0.35–4.50)

## 2017-05-11 NOTE — Telephone Encounter (Signed)
Done and in IN box 

## 2017-05-11 NOTE — Telephone Encounter (Signed)
Pt dropped off form to be filled out for work. Placed in RX tower °

## 2017-05-11 NOTE — Telephone Encounter (Signed)
In your inbox.

## 2017-05-12 ENCOUNTER — Encounter: Payer: Self-pay | Admitting: Family Medicine

## 2017-05-12 NOTE — Telephone Encounter (Signed)
Form faxed and pt notified and advise her copy is ready for pick up

## 2018-01-18 ENCOUNTER — Ambulatory Visit: Payer: No Typology Code available for payment source | Admitting: Family Medicine

## 2018-01-18 ENCOUNTER — Encounter: Payer: Self-pay | Admitting: Family Medicine

## 2018-01-18 VITALS — BP 122/76 | HR 82 | Temp 98.0°F | Ht 64.0 in | Wt 226.8 lb

## 2018-01-18 DIAGNOSIS — J209 Acute bronchitis, unspecified: Secondary | ICD-10-CM

## 2018-01-18 MED ORDER — PREDNISONE 10 MG PO TABS: ORAL_TABLET | ORAL | 0 refills | Status: DC

## 2018-01-18 MED ORDER — AZITHROMYCIN 250 MG PO TABS: ORAL_TABLET | ORAL | 0 refills | Status: DC

## 2018-01-18 NOTE — Progress Notes (Signed)
Subjective:    Patient ID: Nicole [name], female    DOB: 1955-12-11, 62 y.o.   MRN: [id number]  HPI  Here with c/o of chest congestion   Started as cold symptoms over a week ago  Swollen glands  Then felt it move into her chest - tightness/prod cough (? Color)   Now still has congested/tight feeling chest  ST as well  No fever  Not coughing a lot  Ears are ok  No headache   Otc: taking generic mucinex (plain)   Patient Active Problem List   Diagnosis Date Noted  . Acute bronchitis 01/18/2018  . Obesity 01/06/2015  . Hypothyroid 09/17/2013  . Encounter for routine gynecological examination 03/28/2012  . Routine general medical examination at a health care facility 03/19/2012  . CYST AND PSEUDOCYST OF PANCREAS 12/31/2009  . NONSPECIFIC ABN FINDING RAD & OTH EXAM GI TRACT 04/07/2009  . Hyperlipidemia LDL goal <130 08/02/2007  . ECZEMA, HANDS 08/02/2007   History reviewed. No pertinent past medical history. History reviewed. No pertinent surgical history. Social History   Tobacco Use  . Smoking status: Never Smoker  . Smokeless tobacco: Never Used  Substance Use Topics  . Alcohol use: Yes    Alcohol/week: 0.0 standard drinks    Comment: 1-2 glasses of wine a week  . Drug use: No   Family History  Problem Relation Age of Onset  . Uterine cancer Mother   . Alcoholism Mother   . Lymphoma Father    No Known Allergies Current Outpatient Medications on File Prior to Visit  Medication Sig Dispense Refill  . atorvastatin (LIPITOR) 10 MG tablet Take 1 tablet (10 mg total) by mouth daily. 90 tablet 3  . levothyroxine (SYNTHROID, LEVOTHROID) 50 MCG tablet Take 1 tablet (50 mcg total) by mouth daily. 30 tablet 11   No current facility-administered medications on file prior to visit.     Review of Systems  Constitutional: Positive for appetite change and fatigue. Negative for fever.  HENT: Positive for postnasal drip and sore throat. Negative for congestion, ear  pain, rhinorrhea, sinus pressure and sneezing.   Eyes: Negative for pain and discharge.  Respiratory: Positive for cough, chest tightness and wheezing. Negative for shortness of breath and stridor.   Cardiovascular: Negative for chest pain.  Gastrointestinal: Negative for diarrhea, nausea and vomiting.  Genitourinary: Negative for frequency, hematuria and urgency.  Musculoskeletal: Negative for arthralgias and myalgias.  Skin: Negative for rash.  Neurological: Positive for headaches. Negative for dizziness, weakness and light-headedness.  Psychiatric/Behavioral: Negative for confusion and dysphoric mood.       Objective:   Physical Exam  Constitutional: She appears well-developed and well-nourished. No distress.  HENT:  Head: Normocephalic and atraumatic.  Right Ear: External ear normal.  Left Ear: External ear normal.  Mouth/Throat: Oropharynx is clear and moist.  Nares are boggy but clear  No sinus tenderness Clear rhinorrhea and post nasal drip   Eyes: Pupils are equal, round, and reactive to light. Conjunctivae and EOM are normal. Right eye exhibits no discharge. Left eye exhibits no discharge.  Neck: Normal range of motion. Neck supple. JVD present.  Cardiovascular: Normal rate and normal heart sounds.  Pulmonary/Chest: Effort normal. No stridor. No respiratory distress. She has wheezes. She has no rales. She exhibits no tenderness.  Good air exch Scant wheeze on forced exp only  No rales Few scattered rhonchi   Lymphadenopathy:    She has no cervical adenopathy.  Neurological: She is alert.  Skin: Skin is warm and dry. No rash noted.  Psychiatric: She has a normal mood and affect.          Assessment & Plan:   Problem List Items Addressed This Visit      Respiratory   Acute bronchitis - Primary    With uri symptoms for over a week -worsening cough and sob  Px prednisone for wheeze/tight chest  Cover with zpak  Fluids/rest/symptoms control  Disc symptomatic  care - see instructions on AVS  Update if not starting to improve in a week or if worsening

## 2018-01-18 NOTE — Assessment & Plan Note (Signed)
With uri symptoms for over a week -worsening cough and sob  Px prednisone for wheeze/tight chest  Cover with zpak  Fluids/rest/symptoms control  Disc symptomatic care - see instructions on AVS  Update if not starting to improve in a week or if worsening

## 2018-01-18 NOTE — Patient Instructions (Signed)
Drink a lot of fluid  If you wheeze - please let us know Rest when you can   Take prednisone as directed for tight chest/bronchospasm with bronchitis Also zithromax   Update if not starting to improve in a week or if worsening  -especially if fever/shortness of breath or wheezing

## 2018-03-28 ENCOUNTER — Other Ambulatory Visit: Payer: Self-pay | Admitting: Family Medicine

## 2018-04-18 ENCOUNTER — Other Ambulatory Visit: Payer: Self-pay | Admitting: Family Medicine

## 2018-04-18 DIAGNOSIS — Z1231 Encounter for screening mammogram for malignant neoplasm of breast: Secondary | ICD-10-CM

## 2018-04-20 ENCOUNTER — Telehealth: Payer: Self-pay | Admitting: Family Medicine

## 2018-04-20 DIAGNOSIS — Z0000 Encounter for general adult medical examination without abnormal findings: Secondary | ICD-10-CM

## 2018-04-20 DIAGNOSIS — E785 Hyperlipidemia, unspecified: Secondary | ICD-10-CM

## 2018-04-20 DIAGNOSIS — E039 Hypothyroidism, unspecified: Secondary | ICD-10-CM

## 2018-04-20 NOTE — Telephone Encounter (Signed)
-----   Message from Ellamae Sia sent at 04/10/2018  3:21 PM EST ----- Regarding: Lab orders for Fridday, 3.6.20 Patient is scheduled for CPX labs, please order future labs, Thanks , Karna Christmas

## 2018-04-21 ENCOUNTER — Other Ambulatory Visit (INDEPENDENT_AMBULATORY_CARE_PROVIDER_SITE_OTHER): Payer: No Typology Code available for payment source

## 2018-04-21 DIAGNOSIS — E785 Hyperlipidemia, unspecified: Secondary | ICD-10-CM | POA: Diagnosis not present

## 2018-04-21 DIAGNOSIS — E039 Hypothyroidism, unspecified: Secondary | ICD-10-CM | POA: Diagnosis not present

## 2018-04-21 DIAGNOSIS — Z0000 Encounter for general adult medical examination without abnormal findings: Secondary | ICD-10-CM

## 2018-04-21 LAB — CBC WITH DIFFERENTIAL/PLATELET
BASOS ABS: 0.1 10*3/uL (ref 0.0–0.1)
Basophils Relative: 1.3 % (ref 0.0–3.0)
Eosinophils Absolute: 0.6 10*3/uL (ref 0.0–0.7)
Eosinophils Relative: 8 % — ABNORMAL HIGH (ref 0.0–5.0)
HCT: 42.1 % (ref 36.0–46.0)
Hemoglobin: 14.5 g/dL (ref 12.0–15.0)
Lymphocytes Relative: 25 % (ref 12.0–46.0)
Lymphs Abs: 1.7 10*3/uL (ref 0.7–4.0)
MCHC: 34.4 g/dL (ref 30.0–36.0)
MCV: 86.9 fl (ref 78.0–100.0)
Monocytes Absolute: 0.6 10*3/uL (ref 0.1–1.0)
Monocytes Relative: 8.4 % (ref 3.0–12.0)
NEUTROS PCT: 57.3 % (ref 43.0–77.0)
Neutro Abs: 4 10*3/uL (ref 1.4–7.7)
Platelets: 228 10*3/uL (ref 150.0–400.0)
RBC: 4.84 Mil/uL (ref 3.87–5.11)
RDW: 14.3 % (ref 11.5–15.5)
WBC: 6.9 10*3/uL (ref 4.0–10.5)

## 2018-04-21 LAB — LIPID PANEL
Cholesterol: 153 mg/dL (ref 0–200)
HDL: 49.4 mg/dL (ref >39.00)
LDL Cholesterol: 84 mg/dL (ref 0–99)
NonHDL: 103.17
Total CHOL/HDL Ratio: 3
Triglycerides: 97 mg/dL (ref 0.0–149.0)
VLDL: 19.4 mg/dL (ref 0.0–40.0)

## 2018-04-21 LAB — TSH: TSH: 4.29 u[IU]/mL (ref 0.35–4.50)

## 2018-04-21 LAB — COMPREHENSIVE METABOLIC PANEL
ALK PHOS: 85 U/L (ref 39–117)
ALT: 27 U/L (ref 0–35)
AST: 23 U/L (ref 0–37)
Albumin: 4.4 g/dL (ref 3.5–5.2)
BILIRUBIN TOTAL: 0.5 mg/dL (ref 0.2–1.2)
BUN: 12 mg/dL (ref 6–23)
CO2: 28 mEq/L (ref 19–32)
Calcium: 9.7 mg/dL (ref 8.4–10.5)
Chloride: 105 mEq/L (ref 96–112)
Creatinine, Ser: 0.94 mg/dL (ref 0.40–1.20)
GFR: 60.08 mL/min (ref >60.00)
Glucose, Bld: 97 mg/dL (ref 70–99)
Potassium: 4.2 mEq/L (ref 3.5–5.1)
Sodium: 140 mEq/L (ref 135–145)
TOTAL PROTEIN: 7.3 g/dL (ref 6.0–8.3)

## 2018-04-24 ENCOUNTER — Ambulatory Visit (INDEPENDENT_AMBULATORY_CARE_PROVIDER_SITE_OTHER): Payer: PRIVATE HEALTH INSURANCE | Admitting: Family Medicine

## 2018-04-24 ENCOUNTER — Encounter: Payer: Self-pay | Admitting: Family Medicine

## 2018-04-24 VITALS — BP 138/72 | HR 73 | Temp 98.0°F | Ht 64.25 in | Wt 227.2 lb

## 2018-04-24 DIAGNOSIS — E6609 Other obesity due to excess calories: Secondary | ICD-10-CM | POA: Diagnosis not present

## 2018-04-24 DIAGNOSIS — E785 Hyperlipidemia, unspecified: Secondary | ICD-10-CM

## 2018-04-24 DIAGNOSIS — Z6838 Body mass index (BMI) 38.0-38.9, adult: Secondary | ICD-10-CM

## 2018-04-24 DIAGNOSIS — E039 Hypothyroidism, unspecified: Secondary | ICD-10-CM

## 2018-04-24 DIAGNOSIS — Z0000 Encounter for general adult medical examination without abnormal findings: Secondary | ICD-10-CM | POA: Diagnosis not present

## 2018-04-24 MED ORDER — LEVOTHYROXINE SODIUM 50 MCG PO TABS: 50.0000 ug | ORAL_TABLET | Freq: Every day | ORAL | 3 refills | Status: DC

## 2018-04-24 MED ORDER — ATORVASTATIN CALCIUM 10 MG PO TABS: 10.0000 mg | ORAL_TABLET | Freq: Every day | ORAL | 3 refills | Status: DC

## 2018-04-24 NOTE — Assessment & Plan Note (Signed)
Reviewed health habits including diet and exercise and skin cancer prevention Reviewed appropriate screening tests for age  Also reviewed health mt list, fam hx and immunization status , as well as social and family history   See HPI Labs reviewed  Disc skin cancer screening  Disc plan for weight loss  Overall good self care Disc imp of ca and D for bone health

## 2018-04-24 NOTE — Patient Instructions (Addendum)
Keep working on [employer] and exercise  Try to get most of your carbohydrates from produce (with the exception of white potatoes)  Eat less bread/pasta/rice/snack foods/cereals/sweets and other items from the middle of the grocery store (processed carbs)  Wear sunscreen when you are outdoors   Keep taking care of yourself   Try to get 1200-1500 mg of calcium per day with at least 1000 iu of vitamin D - for bone health

## 2018-04-24 NOTE — Assessment & Plan Note (Signed)
Discussed how this problem influences overall health and the risks it imposes  Reviewed plan for weight loss with lower calorie diet (via better food choices and also portion control or program like weight watchers) and exercise building up to or more than 30 minutes 5 days per week including some aerobic activity    

## 2018-04-24 NOTE — Assessment & Plan Note (Signed)
Disc goals for lipids and reasons to control them Rev last labs with pt Rev low sat fat diet in detail  Well controlled with atorvastatin and diet  

## 2018-04-24 NOTE — Progress Notes (Signed)
Subjective:    Patient ID: Nicole [name], female    DOB: 1955/12/11, 63 y.o.   MRN: [id number]  HPI Here for health maintenance exam and to review chronic medical problems  On spring break! Will catch up on personal things  Has a wellness program at work - will drop off form when they give it to her     Wt Readings from Last 3 Encounters:  04/24/18 227 lb 3 oz (103.1 kg)  01/18/18 226 lb 12 oz (102.9 kg)  03/28/17 224 lb 4 oz (101.7 kg)  stable  38.69 kg/m   Self care:  Exercise-doing ok /not as much but more walking with work  Has lost some inches!  Diet -started cutting back breads for about 2 weeks  Also no pasta /snack foods  Counting carbs   Mammogram 3/19 -has one scheduled for this week  Self breast exam - no lumps or changes   Pap 2/18 neg/neg HPV No gyn problems   Tetanus shot 3/11  Colonoscopy 4/11- 10 y recall    Flu shot 10/19   zostavax 2/17  Hypothyroidism  Pt has no clinical changes No change in energy level/ hair or skin/ edema and no tremor Lab Results  Component Value Date   TSH 4.29 04/21/2018     Hyperlipidemia Lab Results  Component Value Date   CHOL 153 04/21/2018   CHOL 155 03/24/2017   CHOL 168 03/24/2016   Lab Results  Component Value Date   HDL 49.40 04/21/2018   HDL 46.50 03/24/2017   HDL 53.50 03/24/2016   Lab Results  Component Value Date   LDLCALC 84 04/21/2018   LDLCALC 91 03/24/2017   LDLCALC 94 03/24/2016   Lab Results  Component Value Date   TRIG 97.0 04/21/2018   TRIG 88.0 03/24/2017   TRIG 107.0 03/24/2016   Lab Results  Component Value Date   CHOLHDL 3 04/21/2018   CHOLHDL 3 03/24/2017   CHOLHDL 3 03/24/2016   Lab Results  Component Value Date   LDLDIRECT 157.8 03/23/2012   LDLDIRECT 154.9 11/27/2009   LDLDIRECT 178.7 08/14/2009   On statin and diet Improved LDL also   Lab Results  Component Value Date   WBC 6.9 04/21/2018   HGB 14.5 04/21/2018   HCT 42.1 04/21/2018   MCV 86.9  04/21/2018   PLT 228.0 04/21/2018   Lab Results  Component Value Date   CREATININE 0.94 04/21/2018   BUN 12 04/21/2018   NA 140 04/21/2018   K 4.2 04/21/2018   CL 105 04/21/2018   CO2 28 04/21/2018   Lab Results  Component Value Date   ALT 27 04/21/2018   AST 23 04/21/2018   ALKPHOS 85 04/21/2018   BILITOT 0.5 04/21/2018   Patient Active Problem List   Diagnosis Date Noted  . Obesity 01/06/2015  . Hypothyroid 09/17/2013  . Encounter for routine gynecological examination 03/28/2012  . Routine general medical examination at a health care facility 03/19/2012  . CYST AND PSEUDOCYST OF PANCREAS 12/31/2009  . NONSPECIFIC ABN FINDING RAD & OTH EXAM GI TRACT 04/07/2009  . Hyperlipidemia LDL goal <130 08/02/2007  . ECZEMA, HANDS 08/02/2007   History reviewed. No pertinent past medical history. History reviewed. No pertinent surgical history. Social History   Tobacco Use  . Smoking status: Never Smoker  . Smokeless tobacco: Never Used  Substance Use Topics  . Alcohol use: Yes    Alcohol/week: 0.0 standard drinks    Comment: 1-2 glasses of wine a  week  . Drug use: No   Family History  Problem Relation Age of Onset  . Uterine cancer Mother   . Alcoholism Mother   . Lymphoma Father    No Known Allergies No current outpatient medications on file prior to visit.   No current facility-administered medications on file prior to visit.      Review of Systems  Constitutional: Negative for activity change, appetite change, fatigue, fever and unexpected weight change.  HENT: Negative for congestion, ear pain, rhinorrhea, sinus pressure and sore throat.   Eyes: Negative for pain, redness and visual disturbance.  Respiratory: Negative for cough, shortness of breath and wheezing.   Cardiovascular: Negative for chest pain and palpitations.  Gastrointestinal: Negative for abdominal pain, blood in stool, constipation and diarrhea.  Endocrine: Negative for polydipsia and polyuria.    Genitourinary: Negative for dysuria, frequency and urgency.  Musculoskeletal: Negative for arthralgias, back pain and myalgias.  Skin: Negative for pallor and rash.  Allergic/Immunologic: Negative for environmental allergies.  Neurological: Negative for dizziness, syncope and headaches.  Hematological: Negative for adenopathy. Does not bruise/bleed easily.  Psychiatric/Behavioral: Negative for decreased concentration and dysphoric mood. The patient is not nervous/anxious.        Objective:   Physical Exam Constitutional:      General: She is not in acute distress.    Appearance: Normal appearance. She is well-developed. She is obese.  HENT:     Head: Normocephalic and atraumatic.     Right Ear: Tympanic membrane, ear canal and external ear normal.     Left Ear: Tympanic membrane, ear canal and external ear normal.     Nose: Nose normal.     Mouth/Throat:     Mouth: Mucous membranes are moist.     Pharynx: Oropharynx is clear.  Eyes:     General: No scleral icterus.    Conjunctiva/sclera: Conjunctivae normal.     Pupils: Pupils are equal, round, and reactive to light.  Neck:     Musculoskeletal: Normal range of motion and neck supple.     Thyroid: No thyromegaly.     Vascular: No carotid bruit or JVD.  Cardiovascular:     Rate and Rhythm: Normal rate and regular rhythm.     Pulses: Normal pulses.     Heart sounds: Normal heart sounds. No gallop.   Pulmonary:     Effort: Pulmonary effort is normal. No respiratory distress.     Breath sounds: Normal breath sounds. No wheezing or rales.  Chest:     Chest wall: No tenderness.  Abdominal:     General: Bowel sounds are normal. There is no distension or abdominal bruit.     Palpations: Abdomen is soft. There is no mass.     Tenderness: There is no abdominal tenderness.  Musculoskeletal: Normal range of motion.        General: No tenderness.     Right lower leg: No edema.     Left lower leg: No edema.  Lymphadenopathy:      Cervical: No cervical adenopathy.  Skin:    General: Skin is warm and dry.     Coloration: Skin is not pale.     Findings: No erythema or rash.     Comments: Ruddy complexion Solar lentigines diffusely   Neurological:     General: No focal deficit present.     Mental Status: She is alert.     Cranial Nerves: No cranial nerve deficit.     Motor: No abnormal muscle  tone.     Coordination: Coordination normal.     Deep Tendon Reflexes: Reflexes are normal and symmetric. Reflexes normal.  Psychiatric:        Mood and Affect: Mood normal.           Assessment & Plan:   Problem List Items Addressed This Visit      Endocrine   Hypothyroid    Hypothyroidism  Pt has no clinical changes No change in energy level/ hair or skin/ edema and no tremor Lab Results  Component Value Date   TSH 4.29 04/21/2018          Relevant Medications   levothyroxine (SYNTHROID, LEVOTHROID) 50 MCG tablet     Other   Hyperlipidemia LDL goal <130    Disc goals for lipids and reasons to control them Rev last labs with pt Rev low sat fat diet in detail Well controlled with atorvastatin and diet       Relevant Medications   atorvastatin (LIPITOR) 10 MG tablet   Routine general medical examination at a health care facility - Primary    Reviewed health habits including diet and exercise and skin cancer prevention Reviewed appropriate screening tests for age  Also reviewed health mt list, fam hx and immunization status , as well as social and family history   See HPI Labs reviewed  Disc skin cancer screening  Disc plan for weight loss  Overall good self care Disc imp of ca and D for bone health        Obesity    Discussed how this problem influences overall health and the risks it imposes  Reviewed plan for weight loss with lower calorie diet (via better food choices and also portion control or program like weight watchers) and exercise building up to or more than 30 minutes 5 days per  week including some aerobic activity

## 2018-04-24 NOTE — Assessment & Plan Note (Signed)
Hypothyroidism  Pt has no clinical changes No change in energy level/ hair or skin/ edema and no tremor Lab Results  Component Value Date   TSH 4.29 04/21/2018

## 2018-04-25 ENCOUNTER — Telehealth: Payer: Self-pay | Admitting: Family Medicine

## 2018-04-25 NOTE — Telephone Encounter (Signed)
In your inbox.

## 2018-04-25 NOTE — Telephone Encounter (Signed)
Done and in IN box 

## 2018-04-25 NOTE — Telephone Encounter (Signed)
Pt dropped off Wellness form for Dr. Glori Bickers to sign form placed in rx tower for Dr. Glori Bickers.

## 2018-04-25 NOTE — Telephone Encounter (Signed)
Pt also asked if we could fax form and call her when it was completed.

## 2018-04-26 NOTE — Telephone Encounter (Signed)
Form faxed and placed at the front for pick up and pt notified

## 2018-04-27 ENCOUNTER — Ambulatory Visit
Admission: RE | Admit: 2018-04-27 | Discharge: 2018-04-27 | Disposition: A | Discharge status: Home / Self Care | Payer: No Typology Code available for payment source | Source: Ambulatory Visit

## 2018-04-27 ENCOUNTER — Other Ambulatory Visit: Payer: Self-pay

## 2018-04-27 DIAGNOSIS — Z1231 Encounter for screening mammogram for malignant neoplasm of breast: Secondary | ICD-10-CM

## 2019-04-22 ENCOUNTER — Telehealth: Payer: Self-pay | Admitting: Family Medicine

## 2019-04-22 DIAGNOSIS — Z0000 Encounter for general adult medical examination without abnormal findings: Secondary | ICD-10-CM

## 2019-04-22 DIAGNOSIS — E785 Hyperlipidemia, unspecified: Secondary | ICD-10-CM

## 2019-04-22 DIAGNOSIS — E039 Hypothyroidism, unspecified: Secondary | ICD-10-CM

## 2019-04-22 NOTE — Telephone Encounter (Signed)
-----   Message from Cloyd Stagers, RT sent at 04/12/2019  1:26 PM EST ----- Regarding: Lab Orders for Monday 3.8.2021 Please place lab orders for Monday 3.8.2021, office visit for physical on Wednesday 3.10.2021 Thank you, Dyke Maes RT(R)

## 2019-04-23 ENCOUNTER — Other Ambulatory Visit: Payer: Self-pay

## 2019-04-23 ENCOUNTER — Other Ambulatory Visit (INDEPENDENT_AMBULATORY_CARE_PROVIDER_SITE_OTHER): Payer: No Typology Code available for payment source

## 2019-04-23 DIAGNOSIS — Z0000 Encounter for general adult medical examination without abnormal findings: Secondary | ICD-10-CM | POA: Diagnosis not present

## 2019-04-23 DIAGNOSIS — E039 Hypothyroidism, unspecified: Secondary | ICD-10-CM | POA: Diagnosis not present

## 2019-04-23 DIAGNOSIS — E785 Hyperlipidemia, unspecified: Secondary | ICD-10-CM | POA: Diagnosis not present

## 2019-04-23 LAB — LIPID PANEL
Cholesterol: 161 mg/dL (ref 0–200)
HDL: 47.6 mg/dL (ref >39.00)
LDL Cholesterol: 97 mg/dL (ref 0–99)
NonHDL: 113.85
Total CHOL/HDL Ratio: 3
Triglycerides: 86 mg/dL (ref 0.0–149.0)
VLDL: 17.2 mg/dL (ref 0.0–40.0)

## 2019-04-23 LAB — CBC WITH DIFFERENTIAL/PLATELET
Basophils Absolute: 0.1 10*3/uL (ref 0.0–0.1)
Basophils Relative: 1.2 % (ref 0.0–3.0)
Eosinophils Absolute: 0.5 10*3/uL (ref 0.0–0.7)
Eosinophils Relative: 7.1 % — ABNORMAL HIGH (ref 0.0–5.0)
HCT: 41.6 % (ref 36.0–46.0)
Hemoglobin: 14.2 g/dL (ref 12.0–15.0)
Lymphocytes Relative: 30.3 % (ref 12.0–46.0)
Lymphs Abs: 2.2 10*3/uL (ref 0.7–4.0)
MCHC: 34.2 g/dL (ref 30.0–36.0)
MCV: 88.8 fl (ref 78.0–100.0)
Monocytes Absolute: 0.7 10*3/uL (ref 0.1–1.0)
Monocytes Relative: 9.6 % (ref 3.0–12.0)
Neutro Abs: 3.7 10*3/uL (ref 1.4–7.7)
Neutrophils Relative %: 51.8 % (ref 43.0–77.0)
Platelets: 217 10*3/uL (ref 150.0–400.0)
RBC: 4.68 Mil/uL (ref 3.87–5.11)
RDW: 13.7 % (ref 11.5–15.5)
WBC: 7.1 10*3/uL (ref 4.0–10.5)

## 2019-04-23 LAB — COMPREHENSIVE METABOLIC PANEL
ALT: 31 U/L (ref 0–35)
AST: 25 U/L (ref 0–37)
Albumin: 4.1 g/dL (ref 3.5–5.2)
Alkaline Phosphatase: 102 U/L (ref 39–117)
BUN: 11 mg/dL (ref 6–23)
CO2: 29 mEq/L (ref 19–32)
Calcium: 9.8 mg/dL (ref 8.4–10.5)
Chloride: 105 mEq/L (ref 96–112)
Creatinine, Ser: 0.89 mg/dL (ref 0.40–1.20)
GFR: 63.79 mL/min (ref >60.00)
Glucose, Bld: 86 mg/dL (ref 70–99)
Potassium: 3.9 mEq/L (ref 3.5–5.1)
Sodium: 140 mEq/L (ref 135–145)
Total Bilirubin: 0.6 mg/dL (ref 0.2–1.2)
Total Protein: 7.3 g/dL (ref 6.0–8.3)

## 2019-04-23 LAB — TSH: TSH: 5.39 u[IU]/mL — ABNORMAL HIGH (ref 0.35–4.50)

## 2019-04-25 ENCOUNTER — Ambulatory Visit (INDEPENDENT_AMBULATORY_CARE_PROVIDER_SITE_OTHER): Payer: No Typology Code available for payment source | Admitting: Family Medicine

## 2019-04-25 ENCOUNTER — Other Ambulatory Visit: Payer: Self-pay

## 2019-04-25 ENCOUNTER — Other Ambulatory Visit: Payer: Self-pay | Admitting: Family Medicine

## 2019-04-25 ENCOUNTER — Encounter: Payer: Self-pay | Admitting: Family Medicine

## 2019-04-25 VITALS — BP 128/74 | HR 66 | Temp 97.2°F | Ht 63.75 in | Wt 228.4 lb

## 2019-04-25 DIAGNOSIS — Z1211 Encounter for screening for malignant neoplasm of colon: Secondary | ICD-10-CM

## 2019-04-25 DIAGNOSIS — Z6839 Body mass index (BMI) 39.0-39.9, adult: Secondary | ICD-10-CM

## 2019-04-25 DIAGNOSIS — E785 Hyperlipidemia, unspecified: Secondary | ICD-10-CM | POA: Diagnosis not present

## 2019-04-25 DIAGNOSIS — Z0000 Encounter for general adult medical examination without abnormal findings: Secondary | ICD-10-CM | POA: Diagnosis not present

## 2019-04-25 DIAGNOSIS — E039 Hypothyroidism, unspecified: Secondary | ICD-10-CM

## 2019-04-25 DIAGNOSIS — Z1231 Encounter for screening mammogram for malignant neoplasm of breast: Secondary | ICD-10-CM

## 2019-04-25 DIAGNOSIS — E6609 Other obesity due to excess calories: Secondary | ICD-10-CM | POA: Diagnosis not present

## 2019-04-25 DIAGNOSIS — E66812 Obesity, class 2: Secondary | ICD-10-CM

## 2019-04-25 MED ORDER — ATORVASTATIN CALCIUM 10 MG PO TABS: 10.0000 mg | ORAL_TABLET | Freq: Every day | ORAL | 3 refills | Status: DC

## 2019-04-25 MED ORDER — LEVOTHYROXINE SODIUM 75 MCG PO TABS: 75.0000 ug | ORAL_TABLET | Freq: Every day | ORAL | 3 refills | Status: DC

## 2019-04-25 NOTE — Assessment & Plan Note (Signed)
Discussed how this problem influences overall health and the risks it imposes  Reviewed plan for weight loss with lower calorie diet (via better food choices and also portion control or program like weight watchers) and exercise building up to or more than 30 minutes 5 days per week including some aerobic activity    

## 2019-04-25 NOTE — Assessment & Plan Note (Signed)
Reviewed health habits including diet and exercise and skin cancer prevention Reviewed appropriate screening tests for age  Also reviewed health mt list, fam hx and immunization status , as well as social and family history   See HPI Labs reviewed  Enc better carb choices for wt loss with exercise  Noted flu shot and first covid vaccine Needs tetanus shot (wait a mo after 2nd covid vaccine)  She is considering shingrix later  Due for pap- pt could not stay for that today due to time constraint (had to get daughter to an appt)  Will plan pelvic exam at a later time/she is not high risk Ref done for colonoscopy  Pt plans to schedule mammogram 4-6 wk after 2nd covid vaccine

## 2019-04-25 NOTE — Patient Instructions (Addendum)
Get your tetanus shot a month or more after your covid vaccine   If you are interested in the shingles vaccine series (Shingrix), call your insurance or pharmacy to check on coverage and location it must be given.  If affordable - you can schedule it here or at your pharmacy depending on coverage    Go up on thyroid dose to 75 mcg daily  Schedule non fasting lab in about 6 weeks   I did the colonoscopy referral for summer and our office will call you    Keep walking  Eat a healthy diet   Try to get most of your carbohydrates from produce (with the exception of white potatoes)  Eat less bread/pasta/rice/snack foods/cereals/sweets and other items from the middle of the grocery store (processed carbs)

## 2019-04-25 NOTE — Assessment & Plan Note (Signed)
Disc goals for lipids and reasons to control them Rev last labs with pt Rev low sat fat diet in detail Stable with atorvastatin

## 2019-04-25 NOTE — Assessment & Plan Note (Signed)
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

## 2019-04-25 NOTE — Progress Notes (Signed)
Subjective:    Patient ID: Nicole [name], female    DOB: 11/13/1955, 64 y.o.   MRN: [id number]  This visit occurred during the SARS-CoV-2 public health emergency.  Safety protocols were in place, including screening questions prior to the visit, additional usage of staff PPE, and extensive cleaning of exam room while observing appropriate contact time as indicated for disinfecting solutions.    HPI Here for health maintenance exam and to review chronic medical problems   Doing ok overall  Is teaching - going ok   Wt Readings from Last 3 Encounters:  04/25/19 228 lb 6 oz (103.6 kg)  04/24/18 227 lb 3 oz (103.1 kg)  01/18/18 226 lb 12 oz (102.9 kg)  walking 2 mi daily  Eating a balanced diet now (harder in the beginning of the pandemic)  Does better in spring/summer  39.51 kg/m   Takes one a day vitamins for women over 50  Calcium constipates   Flu vaccine - had in the fall   Had covid shot -did well 2nd one will be April 1   Td 3/11  Pap 2/18 -nl with neg HPV  Mammogram 3/20 - will get this 4-6 weeks after her 2nd covid shot in April  Self breast exam   Colonoscopy 4/11  - 10 y recall  She wants to do it in the summer  She has a pancreatic pseudo cyst    Zoster status - zostavax 2/17  May be interested in the shingrix vaccine    Hypothyroidism  Pt has no clinical changes No change in energy level/ hair or skin/ edema and no tremor Lab Results  Component Value Date   TSH 5.39 (H) 04/23/2019    May have changed generic  Will need to go up on dose    Hyperlipidemia Lab Results  Component Value Date   CHOL 161 04/23/2019   CHOL 153 04/21/2018   CHOL 155 03/24/2017   Lab Results  Component Value Date   HDL 47.60 04/23/2019   HDL 49.40 04/21/2018   HDL 46.50 03/24/2017   Lab Results  Component Value Date   LDLCALC 97 04/23/2019   LDLCALC 84 04/21/2018   LDLCALC 91 03/24/2017   Lab Results  Component Value Date   TRIG 86.0 04/23/2019   TRIG  97.0 04/21/2018   TRIG 88.0 03/24/2017   Lab Results  Component Value Date   CHOLHDL 3 04/23/2019   CHOLHDL 3 04/21/2018   CHOLHDL 3 03/24/2017   Lab Results  Component Value Date   LDLDIRECT 157.8 03/23/2012   LDLDIRECT 154.9 11/27/2009   LDLDIRECT 178.7 08/14/2009   Taking atorvastatin  Overall stable   Lab Results  Component Value Date   WBC 7.1 04/23/2019   HGB 14.2 04/23/2019   HCT 41.6 04/23/2019   MCV 88.8 04/23/2019   PLT 217.0 04/23/2019    Lab Results  Component Value Date   CREATININE 0.89 04/23/2019   BUN 11 04/23/2019   NA 140 04/23/2019   K 3.9 04/23/2019   CL 105 04/23/2019   CO2 29 04/23/2019   Lab Results  Component Value Date   ALT 31 04/23/2019   AST 25 04/23/2019   ALKPHOS 102 04/23/2019   BILITOT 0.6 04/23/2019    Patient Active Problem List   Diagnosis Date Noted  . Colon cancer screening 04/25/2019  . Obesity 01/06/2015  . Hypothyroid 09/17/2013  . Encounter for routine gynecological examination 03/28/2012  . Routine general medical examination at a health care facility  03/19/2012  . CYST AND PSEUDOCYST OF PANCREAS 12/31/2009  . NONSPECIFIC ABN FINDING RAD & OTH EXAM GI TRACT 04/07/2009  . Hyperlipidemia LDL goal <130 08/02/2007  . ECZEMA, HANDS 08/02/2007   History reviewed. No pertinent past medical history. History reviewed. No pertinent surgical history. Social History   Tobacco Use  . Smoking status: Never Smoker  . Smokeless tobacco: Never Used  Substance Use Topics  . Alcohol use: Yes    Alcohol/week: 0.0 standard drinks    Comment: 1-2 glasses of wine a week  . Drug use: No   Family History  Problem Relation Age of Onset  . Uterine cancer Mother   . Alcoholism Mother   . Lymphoma Father    No Known Allergies No current outpatient medications on file prior to visit.   No current facility-administered medications on file prior to visit.     Review of Systems  Constitutional: Negative for activity change,  appetite change, fatigue, fever and unexpected weight change.  HENT: Negative for congestion, ear pain, rhinorrhea, sinus pressure and sore throat.   Eyes: Negative for pain, redness and visual disturbance.  Respiratory: Negative for cough, shortness of breath and wheezing.   Cardiovascular: Negative for chest pain and palpitations.  Gastrointestinal: Negative for abdominal pain, blood in stool, constipation and diarrhea.  Endocrine: Negative for polydipsia and polyuria.  Genitourinary: Negative for dysuria, frequency and urgency.  Musculoskeletal: Negative for arthralgias, back pain and myalgias.  Skin: Negative for pallor and rash.  Allergic/Immunologic: Negative for environmental allergies.  Neurological: Negative for dizziness, syncope and headaches.  Hematological: Negative for adenopathy. Does not bruise/bleed easily.  Psychiatric/Behavioral: Negative for decreased concentration and dysphoric mood. The patient is not nervous/anxious.        Objective:   Physical Exam Constitutional:      General: She is not in acute distress.    Appearance: Normal appearance. She is well-developed. She is obese. She is not ill-appearing or diaphoretic.  HENT:     Head: Normocephalic and atraumatic.     Right Ear: Tympanic membrane, ear canal and external ear normal.     Left Ear: Tympanic membrane, ear canal and external ear normal.     Nose: Nose normal. No congestion.     Mouth/Throat:     Mouth: Mucous membranes are moist.     Pharynx: Oropharynx is clear. No posterior oropharyngeal erythema.  Eyes:     General: No scleral icterus.    Extraocular Movements: Extraocular movements intact.     Conjunctiva/sclera: Conjunctivae normal.     Pupils: Pupils are equal, round, and reactive to light.  Neck:     Thyroid: No thyromegaly.     Vascular: No carotid bruit or JVD.  Cardiovascular:     Rate and Rhythm: Normal rate and regular rhythm.     Pulses: Normal pulses.     Heart sounds: Normal  heart sounds. No gallop.   Pulmonary:     Effort: Pulmonary effort is normal. No respiratory distress.     Breath sounds: Normal breath sounds. No wheezing.     Comments: Good air exch Chest:     Chest wall: No tenderness.  Abdominal:     General: Bowel sounds are normal. There is no distension or abdominal bruit.     Palpations: Abdomen is soft. There is no mass.     Tenderness: There is no abdominal tenderness.     Hernia: No hernia is present.  Genitourinary:    Comments: Breast exam: No mass,  nodules, thickening, tenderness, bulging, retraction, inflamation, nipple discharge or skin changes noted.  No axillary or clavicular LA.     Musculoskeletal:        General: No tenderness. Normal range of motion.     Cervical back: Normal range of motion and neck supple. No rigidity. No muscular tenderness.     Right lower leg: No edema.     Left lower leg: No edema.  Lymphadenopathy:     Cervical: No cervical adenopathy.  Skin:    General: Skin is warm and dry.     Coloration: Skin is not pale.     Findings: No erythema or rash.     Comments: Solar lentigines diffusely   Neurological:     Mental Status: She is alert. Mental status is at baseline.     Cranial Nerves: No cranial nerve deficit.     Motor: No abnormal muscle tone.     Coordination: Coordination normal.     Gait: Gait normal.     Deep Tendon Reflexes: Reflexes are normal and symmetric. Reflexes normal.  Psychiatric:        Mood and Affect: Mood normal.        Cognition and Memory: Cognition and memory normal.     Comments: pleasant           Assessment & Plan:   Problem List Items Addressed This Visit      Endocrine   Hypothyroid    Lab Results  Component Value Date   TSH 5.39 (H) 04/23/2019   This was up/no missed doses and taking correctly  Did have a generic switch  Inc dose to 75 mcg Will schedule re check in 6 wk      Relevant Medications   levothyroxine (SYNTHROID) 75 MCG tablet   Other  Relevant Orders   TSH     Other   Hyperlipidemia LDL goal <130    Disc goals for lipids and reasons to control them Rev last labs with pt Rev low sat fat diet in detail Stable with atorvastatin       Relevant Medications   atorvastatin (LIPITOR) 10 MG tablet   Routine general medical examination at a health care facility - Primary    Reviewed health habits including diet and exercise and skin cancer prevention Reviewed appropriate screening tests for age  Also reviewed health mt list, fam hx and immunization status , as well as social and family history   See HPI Labs reviewed  Enc better carb choices for wt loss with exercise  Noted flu shot and first covid vaccine Needs tetanus shot (wait a mo after 2nd covid vaccine)  She is considering shingrix later  Due for pap- pt could not stay for that today due to time constraint (had to get daughter to an appt)  Will plan pelvic exam at a later time/she is not high risk Ref done for colonoscopy  Pt plans to schedule mammogram 4-6 wk after 2nd covid vaccine         Obesity    Discussed how this problem influences overall health and the risks it imposes  Reviewed plan for weight loss with lower calorie diet (via better food choices and also portion control or program like weight watchers) and exercise building up to or more than 30 minutes 5 days per week including some aerobic activity         Colon cancer screening    Due for colonoscopy in April 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

## 2019-04-25 NOTE — Assessment & Plan Note (Signed)
Lab Results  Component Value Date   TSH 5.39 (H) 04/23/2019   This was up/no missed doses and taking correctly  Did have a generic switch  Inc dose to 75 mcg Will schedule re check in 6 wk

## 2019-06-01 ENCOUNTER — Encounter: Payer: Self-pay | Admitting: Internal Medicine

## 2019-06-08 ENCOUNTER — Other Ambulatory Visit: Payer: Self-pay

## 2019-06-08 ENCOUNTER — Other Ambulatory Visit (INDEPENDENT_AMBULATORY_CARE_PROVIDER_SITE_OTHER): Payer: No Typology Code available for payment source

## 2019-06-08 DIAGNOSIS — E039 Hypothyroidism, unspecified: Secondary | ICD-10-CM

## 2019-06-08 LAB — TSH: TSH: 1.66 u[IU]/mL (ref 0.35–4.50)

## 2019-06-15 ENCOUNTER — Other Ambulatory Visit: Payer: Self-pay | Admitting: Family Medicine

## 2019-06-28 ENCOUNTER — Other Ambulatory Visit: Payer: Self-pay | Admitting: Family Medicine

## 2019-06-28 DIAGNOSIS — Z1231 Encounter for screening mammogram for malignant neoplasm of breast: Secondary | ICD-10-CM

## 2019-07-03 ENCOUNTER — Ambulatory Visit
Admission: RE | Admit: 2019-07-03 | Discharge: 2019-07-03 | Disposition: A | Discharge status: Home / Self Care | Payer: No Typology Code available for payment source | Source: Ambulatory Visit

## 2019-07-03 ENCOUNTER — Other Ambulatory Visit: Payer: Self-pay

## 2019-07-03 ENCOUNTER — Ambulatory Visit (AMBULATORY_SURGERY_CENTER): Payer: Self-pay | Admitting: *Deleted

## 2019-07-03 VITALS — Temp 96.6°F | Ht 63.75 in | Wt 228.0 lb

## 2019-07-03 DIAGNOSIS — Z1211 Encounter for screening for malignant neoplasm of colon: Secondary | ICD-10-CM

## 2019-07-03 DIAGNOSIS — Z1231 Encounter for screening mammogram for malignant neoplasm of breast: Secondary | ICD-10-CM

## 2019-07-03 NOTE — Progress Notes (Signed)
Patient is here in-person for PV. Patient denies any allergies to eggs or soy. Patient denies any problems with anesthesia/sedation. Patient denies any oxygen use at home. Patient denies taking any diet/weight loss medications or blood thinners. Patient is not being treated for MRSA or C-diff. Patient is aware of our care-partner policy and 0000000 safety protocol. Completed vaccines x2 05/17/2019.

## 2019-07-17 ENCOUNTER — Encounter: Payer: Self-pay | Admitting: Internal Medicine

## 2019-07-17 ENCOUNTER — Ambulatory Visit (AMBULATORY_SURGERY_CENTER): Payer: No Typology Code available for payment source | Admitting: Internal Medicine

## 2019-07-17 ENCOUNTER — Other Ambulatory Visit: Payer: Self-pay

## 2019-07-17 VITALS — BP 124/79 | HR 59 | Temp 97.1°F | Resp 10 | Ht 63.75 in | Wt 228.0 lb

## 2019-07-17 DIAGNOSIS — Z86010 Personal history of colonic polyps: Secondary | ICD-10-CM

## 2019-07-17 DIAGNOSIS — D124 Benign neoplasm of descending colon: Secondary | ICD-10-CM

## 2019-07-17 DIAGNOSIS — D120 Benign neoplasm of cecum: Secondary | ICD-10-CM | POA: Diagnosis not present

## 2019-07-17 DIAGNOSIS — Z1211 Encounter for screening for malignant neoplasm of colon: Secondary | ICD-10-CM | POA: Diagnosis present

## 2019-07-17 DIAGNOSIS — Z860101 Personal history of adenomatous and serrated colon polyps: Secondary | ICD-10-CM

## 2019-07-17 HISTORY — DX: Personal history of colonic polyps: Z86.010

## 2019-07-17 HISTORY — DX: Personal history of adenomatous and serrated colon polyps: Z86.0101

## 2019-07-17 MED ORDER — SODIUM CHLORIDE 0.9 % IV SOLN
500.0000 mL | Freq: Once | INTRAVENOUS | Status: DC
Start: 2019-07-17 — End: 2020-05-19

## 2019-07-17 NOTE — Progress Notes (Signed)
Report given to PACU, vss 

## 2019-07-17 NOTE — Op Note (Signed)
[facility] Patient Name: Nicole [name] Procedure Date: 07/17/2019 10:25 AM MRN: [id number] Endoscopist: Gatha Mayer , MD Age: 64 Referring MD:  Date of Birth: Jul 27, 1955 Gender: Female Account #: [id number] Procedure:                Colonoscopy Indications:              Screening for colorectal malignant neoplasm, Last                            colonoscopy: 2011 Medicines:                Propofol per Anesthesia, Monitored Anesthesia Care Procedure:                Pre-Anesthesia Assessment:                           - Prior to the procedure, a History and Physical                            was performed, and patient medications and                            allergies were reviewed. The patient's tolerance of                            previous anesthesia was also reviewed. The risks                            and benefits of the procedure and the sedation                            options and risks were discussed with the patient.                            All questions were answered, and informed consent                            was obtained. Prior Anticoagulants: The patient has                            taken no previous anticoagulant or antiplatelet                            agents. ASA Grade Assessment: II - A patient with                            mild systemic disease. After reviewing the risks                            and benefits, the patient was deemed in                            satisfactory condition to undergo the procedure.  After obtaining informed consent, the colonoscope                            was passed under direct vision. Throughout the                            procedure, the patient's blood pressure, pulse, and                            oxygen saturations were monitored continuously. The                            Colonoscope was introduced through the anus and                            advanced to the  the cecum, identified by                            appendiceal orifice and ileocecal valve. The                            patient tolerated the procedure well. The                            colonoscopy was somewhat difficult due to                            significant looping. Successful completion of the                            procedure was aided by applying abdominal pressure.                            The quality of the bowel preparation was good. The                            bowel preparation used was Miralax via split dose                            instruction. The ileocecal valve, appendiceal                            orifice, and rectum were photographed. Scope In: 10:30:59 AM Scope Out: 10:50:56 AM Scope Withdrawal Time: 0 hours 11 minutes 41 seconds  Total Procedure Duration: 0 hours 19 minutes 57 seconds  Findings:                 Skin tags were found on perianal exam.                           The digital rectal exam was normal.                           Two sessile polyps were found in the descending  colon and cecum. The polyps were 2 mm in size.                            These polyps were removed with a cold snare.                            Resection and retrieval were complete. Verification                            of patient identification for the specimen was                            done. Estimated blood loss was minimal.                           Many small and large-mouthed diverticula were found                            in the sigmoid colon, descending colon and                            transverse colon.                           External and internal hemorrhoids were found.                           Anal papilla(e) were hypertrophied.                           The exam was otherwise without abnormality on                            direct and retroflexion views. Complications:            No immediate  complications. Estimated Blood Loss:     Estimated blood loss was minimal. Impression:               - Perianal skin tags found on perianal exam.                           - Two 2 mm polyps in the descending colon and in                            the cecum, removed with a cold snare. Resected and                            retrieved.                           - Diverticulosis in the sigmoid colon, in the                            descending colon and in the transverse colon.                           -  External and internal hemorrhoids.                           - Anal papilla(e) were hypertrophied.                           - The examination was otherwise normal on direct                            and retroflexion views. Recommendation:           - Patient has a contact number available for                            emergencies. The signs and symptoms of potential                            delayed complications were discussed with the                            patient. Return to normal activities tomorrow.                            Written discharge instructions were provided to the                            patient.                           - Resume previous diet.                           - Continue present medications.                           - Repeat colonoscopy is recommended. The                            colonoscopy date will be determined after pathology                            results from today's exam become available for                            review. Gatha Mayer, MD 07/17/2019 10:59:40 AM This report has been signed electronically.

## 2019-07-17 NOTE — Progress Notes (Signed)
VS taken by DT   Pt's states no medical or surgical changes since previsit on 07/03/2019.

## 2019-07-17 NOTE — Patient Instructions (Addendum)
I found and removed 2 tiny polyps.  Also saw diverticulosis and hemorrhoids.  I will let you know pathology results and when to have another routine colonoscopy by mail and/or My Chart.  I appreciate the opportunity to care for you. Gatha Mayer, MD, FACG    YOU HAD AN ENDOSCOPIC PROCEDURE TODAY AT [facility]:   Refer to the procedure report that was given to you for any specific questions about what was found during the examination.  If the procedure report does not answer your questions, please call your gastroenterologist to clarify.  If you requested that your care partner not be given the details of your procedure findings, then the procedure report has been included in a sealed envelope for you to review at your convenience later.  YOU SHOULD EXPECT: Some feelings of bloating in the abdomen. Passage of more gas than usual.  Walking can help get rid of the air that was put into your GI tract during the procedure and reduce the bloating. If you had a lower endoscopy (such as a colonoscopy or flexible sigmoidoscopy) you may notice spotting of blood in your stool or on the toilet paper. If you underwent a bowel prep for your procedure, you may not have a normal bowel movement for a few days.  Please Note:  You might notice some irritation and congestion in your nose or some drainage.  This is from the oxygen used during your procedure.  There is no need for concern and it should clear up in a day or so.  SYMPTOMS TO REPORT IMMEDIATELY:   Following lower endoscopy (colonoscopy or flexible sigmoidoscopy):  Excessive amounts of blood in the stool  Significant tenderness or worsening of abdominal pains  Swelling of the abdomen that is new, acute  Fever of 100F or higher  For urgent or emergent issues, a gastroenterologist can be reached at any hour by calling [phone]. Do not use MyChart messaging for urgent concerns.    DIET:  We do recommend a small meal at  first, but then you may proceed to your regular diet.  Drink plenty of fluids but you should avoid alcoholic beverages for 24 hours.  ACTIVITY:  You should plan to take it easy for the rest of today and you should NOT DRIVE or use heavy machinery until tomorrow (because of the sedation medicines used during the test).    FOLLOW UP: Our staff will call the number listed on your records 48-72 hours following your procedure to check on you and address any questions or concerns that you may have regarding the information given to you following your procedure. If we do not reach you, we will leave a message.  We will attempt to reach you two times.  During this call, we will ask if you have developed any symptoms of COVID 19. If you develop any symptoms (ie: fever, flu-like symptoms, shortness of breath, cough etc.) before then, please call [phone].  If you test positive for Covid 19 in the 2 weeks post procedure, please call and report this information to [place].    If any biopsies were taken you will be contacted by phone or by letter within the next 1-3 weeks.  Please call us at [phone] if you have not heard about the biopsies in 3 weeks.    SIGNATURES/CONFIDENTIALITY: You and/or your care partner have signed paperwork which will be entered into your electronic medical record.  These signatures attest to the fact that  that the information above on your After Visit Summary has been reviewed and is understood.  Full responsibility of the confidentiality of this discharge information lies with you and/or your care-partner.     Handouts were given to you on Colon Polyps, Diverticulosis, and Hemorrhoids. You may resume your current medications today. Await biopsy results. Please call if any questions or concerns.

## 2019-07-19 ENCOUNTER — Telehealth: Payer: Self-pay

## 2019-07-19 ENCOUNTER — Telehealth: Payer: Self-pay | Admitting: *Deleted

## 2019-07-19 NOTE — Telephone Encounter (Signed)
  Follow up Call-  Call back number 07/17/2019  Post procedure Call Back phone  # [phone]  Permission to leave phone message Yes  Some recent data might be hidden     Patient questions:  Do you have a fever, pain , or abdominal swelling? No. Pain Score  0 *  Have you tolerated food without any problems? Yes.    Have you been able to return to your normal activities? Yes.    Do you have any questions about your discharge instructions: Diet   No. Medications  No. Follow up visit  No.  Do you have questions or concerns about your Care? No.  Actions: * If pain score is 4 or above: No action needed, pain <4.  1. Have you developed a fever since your procedure? no  2.   Have you had an respiratory symptoms (SOB or cough) since your procedure? no  3.   Have you tested positive for COVID 19 since your procedure no  4.   Have you had any family members/close contacts diagnosed with the COVID 19 since your procedure?  no   If yes to any of these questions please route to Joylene John, RN and Erenest Rasher, RN

## 2019-07-19 NOTE — Telephone Encounter (Signed)
Message left

## 2019-07-23 ENCOUNTER — Encounter: Payer: Self-pay | Admitting: Internal Medicine

## 2019-12-05 ENCOUNTER — Encounter: Payer: Self-pay | Admitting: Family Medicine

## 2020-02-07 ENCOUNTER — Telehealth: Payer: No Typology Code available for payment source | Admitting: Physician Assistant

## 2020-02-07 DIAGNOSIS — R21 Rash and other nonspecific skin eruption: Secondary | ICD-10-CM | POA: Diagnosis not present

## 2020-02-07 MED ORDER — VALACYCLOVIR HCL 1 G PO TABS: 1000.0000 mg | ORAL_TABLET | Freq: Three times a day (TID) | ORAL | 0 refills | Status: AC

## 2020-02-07 MED ORDER — GABAPENTIN 300 MG PO CAPS: 300.0000 mg | ORAL_CAPSULE | Freq: Two times a day (BID) | ORAL | 0 refills | Status: DC

## 2020-02-07 NOTE — Progress Notes (Signed)
E-visit for Shingles   We are sorry that you are not feeling well. Here is how we plan to help!  Based on what you shared with me it looks like you have shingles.  Shingles or herpes zoster, is a common infection of the nerves.  It is a painful rash caused by the herpes zoster virus.  This is the same virus that causes chickenpox.  After a person has chickenpox, the virus remains inactive in the nerve cells.  Years later, the virus can become active again and travel to the skin.  It typically will appear on one side of the face or body.  Burning or shooting pain, tingling, or itching are early signs of the infection.  Blisters typically scab over in 7 to 10 days and clear up within 2-4 weeks. Shingles is only contagious to people that have never had the chickenpox, the chickenpox vaccine, or anyone who has a compromised immune system.  You should avoid contact with these type of people until your blisters scab over.  I have prescribed Valacyclovir 1g three times daily for 7 days and also Gabapentin 300mg  twice daily as needed for pain   HOME CARE: . Apply ice packs (wrapped in a thin towel), cool compresses, or soak in cool bath to help reduce pain. . Use calamine lotion to calm itchy skin. . Avoid scratching the rash. . Avoid direct sunlight.  GET HELP RIGHT AWAY IF: . Symptoms that don't away after treatment. . A rash or blisters near your eye. . Increased drainage, fever, or rash after treatment. . Severe pain that doesn't go away.   MAKE SURE YOU    Understand these instructions.  Will watch your condition.  Will get help right away if you are not doing well or get worse.  Thank you for choosing an e-visit. Your e-visit answers were reviewed by a board certified advanced clinical practitioner to complete your personal care plan. Depending upon the condition, your plan could have included both over the counter or prescription medications.  Please review your pharmacy choice.  Make sure the pharmacy is open so you can pick up prescription now. If there is a problem, you may contact your provider through [employer] and have the prescription routed to another pharmacy.  Your safety is important to [place]. If you have drug allergies check your prescription carefully.   For the next 24 hours you can use MyChart to ask questions about today's visit, request a non-urgent call back, or ask for a work or school excuse.  You will get an email in the next two days asking about your experience. I hope that your e-visit has been valuable and will speed your recovery  Greater than 5 minutes, yet less than 10 minutes of time have been spent researching, coordinating and implementing care for this patient today.

## 2020-03-08 ENCOUNTER — Other Ambulatory Visit: Payer: Self-pay | Admitting: Family Medicine

## 2020-04-26 ENCOUNTER — Telehealth: Payer: Self-pay | Admitting: Family Medicine

## 2020-04-26 DIAGNOSIS — Z0000 Encounter for general adult medical examination without abnormal findings: Secondary | ICD-10-CM

## 2020-04-26 DIAGNOSIS — E785 Hyperlipidemia, unspecified: Secondary | ICD-10-CM

## 2020-04-26 DIAGNOSIS — E039 Hypothyroidism, unspecified: Secondary | ICD-10-CM

## 2020-04-26 NOTE — Telephone Encounter (Signed)
-----   Message from Ellamae Sia sent at 04/14/2020 10:30 AM EST ----- Regarding: Lab orders for Monday, 3.14.22 Patient is scheduled for CPX labs, please order future labs, Thanks , Karna Christmas

## 2020-04-28 ENCOUNTER — Other Ambulatory Visit: Payer: Self-pay

## 2020-04-28 ENCOUNTER — Other Ambulatory Visit (INDEPENDENT_AMBULATORY_CARE_PROVIDER_SITE_OTHER): Payer: No Typology Code available for payment source

## 2020-04-28 DIAGNOSIS — Z0000 Encounter for general adult medical examination without abnormal findings: Secondary | ICD-10-CM

## 2020-04-28 DIAGNOSIS — E039 Hypothyroidism, unspecified: Secondary | ICD-10-CM

## 2020-04-28 DIAGNOSIS — E785 Hyperlipidemia, unspecified: Secondary | ICD-10-CM

## 2020-04-28 LAB — LIPID PANEL
Cholesterol: 158 mg/dL (ref 0–200)
HDL: 50.2 mg/dL (ref >39.00)
LDL Cholesterol: 92 mg/dL (ref 0–99)
NonHDL: 107.58
Total CHOL/HDL Ratio: 3
Triglycerides: 79 mg/dL (ref 0.0–149.0)
VLDL: 15.8 mg/dL (ref 0.0–40.0)

## 2020-04-28 LAB — COMPREHENSIVE METABOLIC PANEL
ALT: 25 U/L (ref 0–35)
AST: 23 U/L (ref 0–37)
Albumin: 4.1 g/dL (ref 3.5–5.2)
Alkaline Phosphatase: 96 U/L (ref 39–117)
BUN: 13 mg/dL (ref 6–23)
CO2: 29 mEq/L (ref 19–32)
Calcium: 9.8 mg/dL (ref 8.4–10.5)
Chloride: 104 mEq/L (ref 96–112)
Creatinine, Ser: 0.8 mg/dL (ref 0.40–1.20)
GFR: 77.37 mL/min (ref >60.00)
Glucose, Bld: 94 mg/dL (ref 70–99)
Potassium: 4.1 mEq/L (ref 3.5–5.1)
Sodium: 141 mEq/L (ref 135–145)
Total Bilirubin: 0.5 mg/dL (ref 0.2–1.2)
Total Protein: 6.8 g/dL (ref 6.0–8.3)

## 2020-04-28 LAB — CBC WITH DIFFERENTIAL/PLATELET
Basophils Absolute: 0.1 10*3/uL (ref 0.0–0.1)
Basophils Relative: 1 % (ref 0.0–3.0)
Eosinophils Absolute: 0.5 10*3/uL (ref 0.0–0.7)
Eosinophils Relative: 6.7 % — ABNORMAL HIGH (ref 0.0–5.0)
HCT: 41.8 % (ref 36.0–46.0)
Hemoglobin: 14 g/dL (ref 12.0–15.0)
Lymphocytes Relative: 30.8 % (ref 12.0–46.0)
Lymphs Abs: 2.2 10*3/uL (ref 0.7–4.0)
MCHC: 33.4 g/dL (ref 30.0–36.0)
MCV: 88.3 fl (ref 78.0–100.0)
Monocytes Absolute: 0.6 10*3/uL (ref 0.1–1.0)
Monocytes Relative: 8.5 % (ref 3.0–12.0)
Neutro Abs: 3.9 10*3/uL (ref 1.4–7.7)
Neutrophils Relative %: 53 % (ref 43.0–77.0)
Platelets: 233 10*3/uL (ref 150.0–400.0)
RBC: 4.73 Mil/uL (ref 3.87–5.11)
RDW: 13.4 % (ref 11.5–15.5)
WBC: 7.3 10*3/uL (ref 4.0–10.5)

## 2020-04-28 LAB — TSH: TSH: 3.53 u[IU]/mL (ref 0.35–4.50)

## 2020-04-30 ENCOUNTER — Encounter: Payer: No Typology Code available for payment source | Admitting: Family Medicine

## 2020-05-19 ENCOUNTER — Encounter: Payer: Self-pay | Admitting: Family Medicine

## 2020-05-19 ENCOUNTER — Other Ambulatory Visit: Payer: Self-pay

## 2020-05-19 ENCOUNTER — Ambulatory Visit (INDEPENDENT_AMBULATORY_CARE_PROVIDER_SITE_OTHER): Payer: No Typology Code available for payment source | Admitting: Family Medicine

## 2020-05-19 VITALS — BP 116/70 | HR 84 | Temp 96.9°F | Ht 64.0 in | Wt 227.5 lb

## 2020-05-19 DIAGNOSIS — B001 Herpesviral vesicular dermatitis: Secondary | ICD-10-CM

## 2020-05-19 DIAGNOSIS — E6609 Other obesity due to excess calories: Secondary | ICD-10-CM

## 2020-05-19 DIAGNOSIS — Z0000 Encounter for general adult medical examination without abnormal findings: Secondary | ICD-10-CM | POA: Diagnosis not present

## 2020-05-19 DIAGNOSIS — Z6839 Body mass index (BMI) 39.0-39.9, adult: Secondary | ICD-10-CM

## 2020-05-19 DIAGNOSIS — E039 Hypothyroidism, unspecified: Secondary | ICD-10-CM | POA: Diagnosis not present

## 2020-05-19 DIAGNOSIS — E785 Hyperlipidemia, unspecified: Secondary | ICD-10-CM | POA: Diagnosis not present

## 2020-05-19 DIAGNOSIS — E2839 Other primary ovarian failure: Secondary | ICD-10-CM | POA: Insufficient documentation

## 2020-05-19 MED ORDER — LEVOTHYROXINE SODIUM 75 MCG PO TABS: 75.0000 ug | ORAL_TABLET | Freq: Every day | ORAL | 0 refills | Status: DC

## 2020-05-19 MED ORDER — ATORVASTATIN CALCIUM 10 MG PO TABS: 10.0000 mg | ORAL_TABLET | Freq: Every day | ORAL | 3 refills | Status: DC

## 2020-05-19 MED ORDER — VALACYCLOVIR HCL 1 G PO TABS
2000.0000 mg | ORAL_TABLET | Freq: Two times a day (BID) | ORAL | 3 refills | Status: DC
Start: 2020-05-19 — End: 2021-05-20

## 2020-05-19 NOTE — Patient Instructions (Addendum)
Schedule your bone density test when you can   You are due for a tetanus shot -wait 30 days after your covid booster   Take care of yourself  Stay active  Eat a healthy balanced diet  Try to get most of your carbohydrates from produce (with the exception of white potatoes)  Eat less bread/pasta/rice/snack foods/cereals/sweets and other items from the middle of the grocery store (processed carbs)  No change in your medicines  Try valtrex for cold sores as needed

## 2020-05-19 NOTE — Assessment & Plan Note (Signed)
Disc goals for lipids and reasons to control them Rev last labs with pt Rev low sat fat diet in detail Well controlled with atorvastatin 10 mg daily  LDL of 92

## 2020-05-19 NOTE — Assessment & Plan Note (Signed)
Px valtrex to take prn

## 2020-05-19 NOTE — Progress Notes (Signed)
Subjective:    Patient ID: Nicole [name], female    DOB: 1955-12-07, 65 y.o.   MRN: [id number]  This visit occurred during the SARS-CoV-2 public health emergency.  Safety protocols were in place, including screening questions prior to the visit, additional usage of staff PPE, and extensive cleaning of exam room while observing appropriate contact time as indicated for disinfecting solutions.    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   covid immunized- and set up for the 2nd booster by choice Had shingrix (did get a mild case of shingrix)  occ gets cold sores now  Flu shot 9/21 Td 3/11- will update a month after upcoming covid shot  Donating blood now  Has not had moles removed recently  pna vaccines - had pna 23 in 2018   Pap 2/18 Wants to do at 72 y  No gyn problems   Lost her sister since she was here last  Aneurysm and strokes in 90s   Mammogram 07/03/19 No lumps on self exam   Colonoscopy 6/21 with 7 year recall (per pt  8 years)  dexa-has not had , wants to do in [place] Falls/fractures - none Supplements -takes vitamin D  Walks up to 10,000 steps per day for exercise /hills   BP Readings from Last 3 Encounters:  05/19/20 116/70  07/17/19 124/79  04/25/19 128/74   Pulse Readings from Last 3 Encounters:  05/19/20 84  07/17/19 (!) 59  04/25/19 66    Hypothyroidism  Pt has no clinical changes No change in energy level/ hair or skin/ edema and no tremor Lab Results  Component Value Date   TSH 3.53 04/28/2020    Taking levothyroxine 75 mcg daily   hyperlipidema Lab Results  Component Value Date   CHOL 158 04/28/2020   CHOL 161 04/23/2019   CHOL 153 04/21/2018   Lab Results  Component Value Date   HDL 50.20 04/28/2020    HDL 47.60 04/23/2019   HDL 49.40 04/21/2018   Lab Results  Component Value Date   LDLCALC 92 04/28/2020   LDLCALC 97 04/23/2019   LDLCALC 84 04/21/2018   Lab Results  Component Value Date   TRIG 79.0 04/28/2020   TRIG 86.0 04/23/2019   TRIG 97.0 04/21/2018   Lab Results  Component Value Date   CHOLHDL 3 04/28/2020   CHOLHDL 3 04/23/2019   CHOLHDL 3 04/21/2018   Lab Results  Component Value Date   LDLDIRECT 157.8 03/23/2012   LDLDIRECT 154.9 11/27/2009   LDLDIRECT 178.7 08/14/2009   Atorvastatin 10 mg daily  Well controlled   Lab Results  Component Value Date   CREATININE 0.80 04/28/2020   BUN 13 04/28/2020   NA 141 04/28/2020   K 4.1 04/28/2020   CL 104 04/28/2020   CO2 29 04/28/2020   Lab Results  Component Value Date   ALT 25 04/28/2020   AST 23 04/28/2020   ALKPHOS 96 04/28/2020   BILITOT 0.5 04/28/2020   Lab Results  Component Value Date   WBC 7.3 04/28/2020   HGB 14.0 04/28/2020   HCT 41.8 04/28/2020   MCV 88.3 04/28/2020   PLT 233.0 04/28/2020   Patient Active Problem List   Diagnosis Date Noted  .  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  . Estrogen deficiency 05/19/2020  . Colon cancer screening 04/25/2019  . Obesity 01/06/2015  . Hypothyroid 09/17/2013  . Encounter for routine gynecological examination 03/28/2012  . Routine general medical examination at a health care facility 03/19/2012  . CYST AND PSEUDOCYST OF PANCREAS 12/31/2009  . NONSPECIFIC ABN FINDING RAD & OTH EXAM GI TRACT 04/07/2009  . Hyperlipidemia LDL goal <130 08/02/2007  . ECZEMA, HANDS 08/02/2007   Past Medical History:  Diagnosis Date  . Hx of adenomatous colonic polyps 07/2019  . Hyperlipidemia   . Peripencreatic cyst 2011   aspirated by EUS  . Thyroid disease    Past Surgical History:  Procedure Laterality Date  . CESAREAN SECTION     x2  . COLONOSCOPY  06/04/2009  . MEDIAL PARTIAL KNEE REPLACEMENT  2017  . UPPER ESOPHAGEAL ENDOSCOPIC ULTRASOUND (EUS)  01/15/2010    Dr.Jacobs   Social History   Tobacco Use  . Smoking status: Never Smoker  . Smokeless tobacco: Never Used  Vaping Use  . Vaping Use: Never used  Substance Use Topics  . Alcohol use: Yes    Alcohol/week: 2.0 standard drinks    Types: 2 Glasses of wine per week    Comment: 1-2 glasses of wine a week  . Drug use: No   Family History  Problem Relation Age of Onset  . Uterine cancer Mother   . Alcoholism Mother   . Lymphoma Father   . Heart attack Father   . Aneurysm Sister   . Colon cancer Neg Hx   . Colon polyps Neg Hx   . Esophageal cancer Neg Hx   . Rectal cancer Neg Hx   . Stomach cancer Neg Hx    No Known Allergies Current Outpatient Medications on File Prior to Visit  Medication Sig Dispense Refill  . VITAMIN D PO Take by mouth.     No current facility-administered medications on file prior to visit.    Review of Systems  Constitutional: Negative for activity change, appetite change, fatigue, fever and unexpected weight change.  HENT: Negative for congestion, ear pain, rhinorrhea, sinus pressure and sore throat.   Eyes: Negative for pain, redness and visual disturbance.  Respiratory: Negative for cough, shortness of breath and wheezing.   Cardiovascular: Negative for chest pain and palpitations.  Gastrointestinal: Negative for abdominal pain, blood in stool, constipation and diarrhea.  Endocrine: Negative for polydipsia and polyuria.  Genitourinary: Negative for dysuria, frequency and urgency.  Musculoskeletal: Negative for arthralgias, back pain and myalgias.  Skin: Negative for pallor and rash.       Cold sores occ  Allergic/Immunologic: Negative for environmental allergies.  Neurological: Negative for dizziness, syncope and headaches.  Hematological: Negative for adenopathy. Does not bruise/bleed easily.  Psychiatric/Behavioral: Negative for decreased concentration and dysphoric mood. The patient is not nervous/anxious.        Objective:   Physical  Exam Constitutional:      General: She is not in acute distress.    Appearance: Normal appearance. She is well-developed. She is not ill-appearing or diaphoretic.  HENT:     Head: Normocephalic and atraumatic.     Right Ear: Tympanic membrane, ear canal and external ear normal.     Left Ear: Tympanic membrane, ear canal and external ear normal.     Nose: Nose normal. No congestion.     Mouth/Throat:     Mouth: Mucous membranes are moist.     Pharynx: Oropharynx is clear. No posterior oropharyngeal erythema.  Eyes:     General: No scleral icterus.    Extraocular Movements: Extraocular movements intact.     Conjunctiva/sclera: Conjunctivae normal.     Pupils: Pupils are equal, round, and reactive to light.  Neck:     Thyroid: No thyromegaly.     Vascular: No carotid bruit or JVD.  Cardiovascular:     Rate and Rhythm: Normal rate and regular rhythm.     Pulses: Normal pulses.     Heart sounds: Normal heart sounds. No gallop.   Pulmonary:     Effort: Pulmonary effort is normal. No respiratory distress.     Breath sounds: Normal breath sounds. No wheezing.     Comments: Good air exch Chest:     Chest wall: No tenderness.  Abdominal:     General: Bowel sounds are normal. There is no distension or abdominal bruit.     Palpations: Abdomen is soft. There is no mass.     Tenderness: There is no abdominal tenderness.     Hernia: No hernia is present.  Genitourinary:    Comments: Breast exam: No mass, nodules, thickening, tenderness, bulging, retraction, inflamation, nipple discharge or skin changes noted.  No axillary or clavicular LA.     Musculoskeletal:        General: No tenderness. Normal range of motion.     Cervical back: Normal range of motion and neck supple. No rigidity. No muscular tenderness.     Right lower leg: No edema.     Left lower leg: No edema.     Comments: No kyphosis   Lymphadenopathy:     Cervical: No cervical adenopathy.  Skin:    General: Skin is warm and  dry.     Coloration: Skin is not pale.     Findings: No erythema or rash.     Comments: Solar lentigines diffusely   Neurological:     Mental Status: She is alert. Mental status is at baseline.     Cranial Nerves: No cranial nerve deficit.     Motor: No abnormal muscle tone.     Coordination: Coordination normal.     Gait: Gait normal.     Deep Tendon Reflexes: Reflexes are normal and symmetric. Reflexes normal.  Psychiatric:        Mood and Affect: Mood normal.        Cognition and Memory: Cognition and memory normal.           Assessment & Plan:   Problem List Items Addressed This Visit      Digestive   Recurrent cold sores    Px valtrex to take prn       Relevant Medications   valACYclovir (VALTREX) 1000 MG tablet     Endocrine   Hypothyroid    Hypothyroidism  Pt has no clinical changes No change in energy level/ hair or skin/ edema and no tremor Lab Results  Component Value Date   TSH 3.53 04/28/2020    Plan to contineu levothyroxoine 75 mcg daily      Relevant Medications   levothyroxine (SYNTHROID) 75 MCG tablet     Other   Hyperlipidemia LDL goal <130    Disc goals for lipids and reasons to control them Rev last labs with pt Rev low sat fat diet in detail Well controlled with atorvastatin 10 mg daily  LDL of 92       Relevant Medications   atorvastatin (LIPITOR) 10 MG tablet   Routine general medical examination at a  health care facility - Primary    Reviewed health habits including diet and exercise and skin cancer prevention Reviewed appropriate screening tests for age  Also reviewed health mt list, fam hx and immunization status , as well as social and family history   See HPI Still working  covid immunized- getting 2nd booster this week  Tetanus due-will schedule at least a mo after above Would update pna 23 in another year Pt wants to put off pap for a year  Mammogram due in may /nl self exam  Colonoscopy utd dexa ordered (no falls  or fractures and takes vitD) Enc wt loss with diet/exercise       Obesity    Discussed how this problem influences overall health and the risks it imposes  Reviewed plan for weight loss with lower calorie diet (via better food choices and also portion control or program like weight watchers) and exercise building up to or more than 30 minutes 5 days per week including some aerobic activity   Good walking  Enc her to eat low glycemic diet       Estrogen deficiency    Referred for first dexa      Relevant Orders   DG Bone Density

## 2020-05-19 NOTE — Assessment & Plan Note (Signed)
Hypothyroidism  Pt has no clinical changes No change in energy level/ hair or skin/ edema and no tremor Lab Results  Component Value Date   TSH 3.53 04/28/2020    Plan to contineu levothyroxoine 75 mcg daily

## 2020-05-19 NOTE — Assessment & Plan Note (Signed)
Reviewed health habits including diet and exercise and skin cancer prevention Reviewed appropriate screening tests for age  Also reviewed health mt list, fam hx and immunization status , as well as social and family history   See HPI Still working  covid immunized- getting 2nd booster this week  Tetanus due-will schedule at least a mo after above Would update pna 23 in another year Pt wants to put off pap for a year  Mammogram due in may /nl self exam  Colonoscopy utd dexa ordered (no falls or fractures and takes vitD) Enc wt loss with diet/exercise

## 2020-05-19 NOTE — Assessment & Plan Note (Signed)
Referred for first dexa

## 2020-05-19 NOTE — Assessment & Plan Note (Signed)
Discussed how this problem influences overall health and the risks it imposes  Reviewed plan for weight loss with lower calorie diet (via better food choices and also portion control or program like weight watchers) and exercise building up to or more than 30 minutes 5 days per week including some aerobic activity   Good walking  Enc her to eat low glycemic diet

## 2020-06-05 ENCOUNTER — Other Ambulatory Visit: Payer: Self-pay | Admitting: Family Medicine

## 2020-06-05 DIAGNOSIS — Z1231 Encounter for screening mammogram for malignant neoplasm of breast: Secondary | ICD-10-CM

## 2020-07-01 ENCOUNTER — Telehealth: Payer: Self-pay | Admitting: Family Medicine

## 2020-07-01 NOTE — Telephone Encounter (Signed)
In that case I guess they just don't cover it, period.  We can try again when she starts using medicare

## 2020-07-01 NOTE — Telephone Encounter (Signed)
Called [facility] and inquired about the orders. She said that all DEXAs are billed as diagnostic and there are no "Screening DEXA orders" FYI to PCP, there is no way to order it screening

## 2020-07-01 NOTE — Telephone Encounter (Signed)
Mrs. Nicole [name] called in due to when she had her physical Dr. Glori Bickers recommended her to have a baseline bone density test and when she went to schedule it they told her it would be diagnostic instead preventive and they told her that it was due to the notes that Dr. Glori Bickers. Wanted to know about what to do due to this is her first one and she would have to pay full price if its diagnosis.

## 2020-07-01 NOTE — Telephone Encounter (Signed)
I wanted it for screening so then that is a no go.  May have to wait until she has medicare.  Please cancel the order if necessary

## 2020-07-17 ENCOUNTER — Other Ambulatory Visit: Payer: No Typology Code available for payment source

## 2020-07-24 ENCOUNTER — Ambulatory Visit
Admission: RE | Admit: 2020-07-24 | Discharge: 2020-07-24 | Disposition: A | Discharge status: Home / Self Care | Payer: No Typology Code available for payment source | Source: Ambulatory Visit

## 2020-07-24 ENCOUNTER — Other Ambulatory Visit: Payer: Self-pay

## 2020-07-24 DIAGNOSIS — Z1231 Encounter for screening mammogram for malignant neoplasm of breast: Secondary | ICD-10-CM

## 2020-08-22 ENCOUNTER — Other Ambulatory Visit: Payer: Self-pay | Admitting: Family Medicine

## 2020-12-19 ENCOUNTER — Other Ambulatory Visit: Payer: Self-pay

## 2020-12-19 ENCOUNTER — Ambulatory Visit (INDEPENDENT_AMBULATORY_CARE_PROVIDER_SITE_OTHER): Payer: No Typology Code available for payment source | Admitting: Family Medicine

## 2020-12-19 ENCOUNTER — Encounter: Payer: Self-pay | Admitting: Family Medicine

## 2020-12-19 DIAGNOSIS — H9201 Otalgia, right ear: Secondary | ICD-10-CM | POA: Diagnosis not present

## 2020-12-19 MED ORDER — FLUTICASONE PROPIONATE 50 MCG/ACT NA SUSP: 2.0000 | Freq: Every day | NASAL | 6 refills | Status: DC

## 2020-12-19 NOTE — Patient Instructions (Signed)
You may have some eustachian tube dysfunction   Try flonase nasal spray daily for 2-4 weeks   Keep watching the glands  If no improvement please let me know  Watch for ear pain and keep [place] posted

## 2020-12-19 NOTE — Progress Notes (Signed)
Subjective:    Patient ID: Nicole [name], female    DOB: 24-Sep-1955, 65 y.o.   MRN: [id number]  This visit occurred during the SARS-CoV-2 public health emergency.  Safety protocols were in place, including screening questions prior to the visit, additional usage of staff PPE, and extensive cleaning of exam room while observing appropriate contact time as indicated for disinfecting solutions.   HPI Pt presents for c/o swollen gland and ear fullness  Wt Readings from Last 3 Encounters:  12/19/20 227 lb 4 oz (103.1 kg)  05/19/20 227 lb 8 oz (103.2 kg)  07/17/19 228 lb (103.4 kg)   39.01 kg/m  Has a swollen area behind her R ear  It goes up and down  For at least a month  Not tender  Does not throb or hurt (just noticeable) Ear feels full as well (more R than L) Seems to have some crust in the ear canal No pain /just fullness No hearing changes  Has sinus trouble this time of year  Congested every fall  No otc medications No ST No fever    Fam hx of lymphoma in father   Most recent labs Lab Results  Component Value Date   WBC 7.3 04/28/2020   HGB 14.0 04/28/2020   HCT 41.8 04/28/2020   MCV 88.3 04/28/2020   PLT 233.0 04/28/2020   Patient Active Problem List   Diagnosis Date Noted   Ear discomfort, right 12/19/2020   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   Estrogen deficiency 05/19/2020   Colon cancer screening 04/25/2019   Obesity 01/06/2015   Hypothyroid 09/17/2013   Encounter for routine gynecological examination 03/28/2012   Routine general medical examination at a health care facility 03/19/2012   CYST AND PSEUDOCYST OF PANCREAS 12/31/2009   NONSPECIFIC ABN FINDING RAD & OTH EXAM GI TRACT 04/07/2009   Hyperlipidemia LDL goal <130 08/02/2007   ECZEMA, HANDS 08/02/2007   Past Medical History:  Diagnosis Date   Hx of adenomatous colonic polyps 07/2019   Hyperlipidemia    Peripencreatic cyst 2011   aspirated by EUS   Thyroid disease    Past Surgical  History:  Procedure Laterality Date   CESAREAN SECTION     x2   COLONOSCOPY  06/04/2009   MEDIAL PARTIAL KNEE REPLACEMENT  2017   UPPER ESOPHAGEAL ENDOSCOPIC ULTRASOUND (EUS)  01/15/2010   Dr.Jacobs   Social History   Tobacco Use   Smoking status: Never   Smokeless tobacco: Never  Vaping Use   Vaping Use: Never used  Substance Use Topics   Alcohol use: Yes    Alcohol/week: 2.0 standard drinks    Types: 2 Glasses of wine per week    Comment: 1-2 glasses of wine a week   Drug use: No   Family History  Problem Relation Age of Onset   Uterine cancer Mother    Alcoholism Mother    Lymphoma Father    Heart attack Father    Aneurysm Sister    Colon cancer Neg Hx    Colon polyps Neg Hx    Esophageal cancer Neg Hx    Rectal cancer Neg Hx    Stomach cancer Neg Hx    No Known Allergies Current Outpatient Medications on File Prior to Visit  Medication Sig Dispense Refill   atorvastatin (LIPITOR) 10 MG tablet Take 1 tablet (10 mg total) by mouth daily. 90 tablet 3   levothyroxine (EUTHYROX) 75 MCG tablet TAKE 1 TABLET BY MOUTH ONCE DAILY BEFORE BREAKFAST 90  tablet 2   VITAMIN D PO Take by mouth.     valACYclovir (VALTREX) 1000 MG tablet Take 2 tablets (2,000 mg total) by mouth 2 (two) times daily. For one day for a cold sore (Patient not taking: Reported on 12/19/2020) 20 tablet 3   No current facility-administered medications on file prior to visit.     Review of Systems  Constitutional:  Negative for activity change, appetite change, fatigue, fever and unexpected weight change.  HENT:  Positive for congestion, ear pain and postnasal drip. Negative for ear discharge, facial swelling, rhinorrhea, sinus pressure, sinus pain, sore throat, tinnitus and trouble swallowing.   Eyes:  Negative for pain, redness and visual disturbance.  Respiratory:  Negative for cough, shortness of breath and wheezing.   Cardiovascular:  Negative for chest pain and palpitations.  Gastrointestinal:   Negative for abdominal pain, blood in stool, constipation and diarrhea.  Endocrine: Negative for polydipsia and polyuria.  Genitourinary:  Negative for dysuria, frequency and urgency.  Musculoskeletal:  Negative for arthralgias, back pain and myalgias.  Skin:  Negative for pallor and rash.  Allergic/Immunologic: Negative for environmental allergies.  Neurological:  Negative for dizziness, syncope and headaches.  Hematological:  Negative for adenopathy. Does not bruise/bleed easily.  Psychiatric/Behavioral:  Negative for decreased concentration and dysphoric mood. The patient is not nervous/anxious.       Objective:   Physical Exam Constitutional:      General: She is not in acute distress.    Appearance: Normal appearance. She is obese. She is not ill-appearing or diaphoretic.  HENT:     Head: Normocephalic and atraumatic.     Comments: No facial or temporal tenderness    Right Ear: Tympanic membrane, ear canal and external ear normal. There is no impacted cerumen.     Left Ear: Tympanic membrane, ear canal and external ear normal. There is no impacted cerumen.     Nose:     Comments: Nares are boggy, hyperemic    Mouth/Throat:     Mouth: Mucous membranes are moist.     Pharynx: Oropharynx is clear. No posterior oropharyngeal erythema.  Eyes:     General:        Right eye: No discharge.        Left eye: No discharge.     Extraocular Movements: Extraocular movements intact.     Conjunctiva/sclera: Conjunctivae normal.     Pupils: Pupils are equal, round, and reactive to light.  Neck:     Vascular: No carotid bruit.  Cardiovascular:     Rate and Rhythm: Normal rate and regular rhythm.  Musculoskeletal:     Cervical back: Normal range of motion and neck supple. No rigidity or tenderness.  Lymphadenopathy:     Cervical: No cervical adenopathy.  Skin:    General: Skin is warm and dry.     Coloration: Skin is not pale.     Findings: No erythema or rash.  Neurological:      Mental Status: She is alert.     Cranial Nerves: No cranial nerve deficit.  Psychiatric:        Mood and Affect: Mood normal.          Assessment & Plan:   Problem List Items Addressed This Visit       Other   Ear discomfort, right    Pt notes full feeling in ears/worse on R with occ pain  Pt c/o fullness in neck behind/under ear, but exam is normal (will monitor this)  Nl cbc in march  Suspect ETD in setting of fall congestion  Recommend flonase ns daily through allergy season/ fall  inst to update if no improvement in 1-2 wk

## 2020-12-20 NOTE — Assessment & Plan Note (Signed)
Pt notes full feeling in ears/worse on R with occ pain  Pt c/o fullness in neck behind/under ear, but exam is normal (will monitor this) Nl cbc in march  Suspect ETD in setting of fall congestion  Recommend flonase ns daily through allergy season/ fall  inst to update if no improvement in 1-2 wk

## 2021-05-11 ENCOUNTER — Telehealth: Payer: Self-pay | Admitting: Family Medicine

## 2021-05-11 DIAGNOSIS — Z0000 Encounter for general adult medical examination without abnormal findings: Secondary | ICD-10-CM

## 2021-05-11 DIAGNOSIS — E039 Hypothyroidism, unspecified: Secondary | ICD-10-CM

## 2021-05-11 DIAGNOSIS — E785 Hyperlipidemia, unspecified: Secondary | ICD-10-CM

## 2021-05-11 NOTE — Telephone Encounter (Signed)
-----   Message from Velna Hatchet, RT sent at 05/06/2021 10:42 AM EDT ----- ?Regarding: Lab order for Wednesday, 05/13/21 ?Patient is scheduled for cpx, please order future labs.  Thanks, Anda Kraft  ? ?

## 2021-05-13 ENCOUNTER — Other Ambulatory Visit (INDEPENDENT_AMBULATORY_CARE_PROVIDER_SITE_OTHER): Payer: No Typology Code available for payment source

## 2021-05-13 ENCOUNTER — Other Ambulatory Visit: Payer: No Typology Code available for payment source

## 2021-05-13 DIAGNOSIS — E785 Hyperlipidemia, unspecified: Secondary | ICD-10-CM | POA: Diagnosis not present

## 2021-05-13 DIAGNOSIS — E039 Hypothyroidism, unspecified: Secondary | ICD-10-CM

## 2021-05-13 DIAGNOSIS — Z0000 Encounter for general adult medical examination without abnormal findings: Secondary | ICD-10-CM | POA: Diagnosis not present

## 2021-05-14 LAB — CBC WITH DIFFERENTIAL/PLATELET
Basophils Absolute: 0.1 10*3/uL (ref 0.0–0.1)
Basophils Relative: 1.3 % (ref 0.0–3.0)
Eosinophils Absolute: 0.4 10*3/uL (ref 0.0–0.7)
Eosinophils Relative: 6.1 % — ABNORMAL HIGH (ref 0.0–5.0)
HCT: 38.7 % (ref 36.0–46.0)
Hemoglobin: 13 g/dL (ref 12.0–15.0)
Lymphocytes Relative: 31.9 % (ref 12.0–46.0)
Lymphs Abs: 2.1 10*3/uL (ref 0.7–4.0)
MCHC: 33.7 g/dL (ref 30.0–36.0)
MCV: 87.5 fl (ref 78.0–100.0)
Monocytes Absolute: 0.5 10*3/uL (ref 0.1–1.0)
Monocytes Relative: 7.1 % (ref 3.0–12.0)
Neutro Abs: 3.5 10*3/uL (ref 1.4–7.7)
Neutrophils Relative %: 53.6 % (ref 43.0–77.0)
Platelets: 220 10*3/uL (ref 150.0–400.0)
RBC: 4.42 Mil/uL (ref 3.87–5.11)
RDW: 14.8 % (ref 11.5–15.5)
WBC: 6.5 10*3/uL (ref 4.0–10.5)

## 2021-05-14 LAB — COMPREHENSIVE METABOLIC PANEL
ALT: 20 U/L (ref 0–35)
AST: 19 U/L (ref 0–37)
Albumin: 4.4 g/dL (ref 3.5–5.2)
Alkaline Phosphatase: 94 U/L (ref 39–117)
BUN: 13 mg/dL (ref 6–23)
CO2: 27 mEq/L (ref 19–32)
Calcium: 10 mg/dL (ref 8.4–10.5)
Chloride: 105 mEq/L (ref 96–112)
Creatinine, Ser: 0.93 mg/dL (ref 0.40–1.20)
GFR: 64.11 mL/min (ref >60.00)
Glucose, Bld: 89 mg/dL (ref 70–99)
Potassium: 4.2 mEq/L (ref 3.5–5.1)
Sodium: 141 mEq/L (ref 135–145)
Total Bilirubin: 0.7 mg/dL (ref 0.2–1.2)
Total Protein: 6.8 g/dL (ref 6.0–8.3)

## 2021-05-14 LAB — LIPID PANEL
Cholesterol: 164 mg/dL (ref 0–200)
HDL: 53.6 mg/dL (ref >39.00)
LDL Cholesterol: 96 mg/dL (ref 0–99)
NonHDL: 110.58
Total CHOL/HDL Ratio: 3
Triglycerides: 74 mg/dL (ref 0.0–149.0)
VLDL: 14.8 mg/dL (ref 0.0–40.0)

## 2021-05-14 LAB — TSH: TSH: 2.39 u[IU]/mL (ref 0.35–5.50)

## 2021-05-20 ENCOUNTER — Ambulatory Visit (INDEPENDENT_AMBULATORY_CARE_PROVIDER_SITE_OTHER): Payer: No Typology Code available for payment source | Admitting: Family Medicine

## 2021-05-20 ENCOUNTER — Encounter: Payer: Self-pay | Admitting: Family Medicine

## 2021-05-20 ENCOUNTER — Telehealth: Payer: Self-pay | Admitting: *Deleted

## 2021-05-20 ENCOUNTER — Encounter: Payer: No Typology Code available for payment source | Admitting: Family Medicine

## 2021-05-20 VITALS — BP 114/70 | HR 60 | Temp 97.7°F | Ht 64.0 in | Wt 227.4 lb

## 2021-05-20 DIAGNOSIS — E2839 Other primary ovarian failure: Secondary | ICD-10-CM

## 2021-05-20 DIAGNOSIS — E785 Hyperlipidemia, unspecified: Secondary | ICD-10-CM | POA: Diagnosis not present

## 2021-05-20 DIAGNOSIS — E039 Hypothyroidism, unspecified: Secondary | ICD-10-CM | POA: Diagnosis not present

## 2021-05-20 DIAGNOSIS — Z1211 Encounter for screening for malignant neoplasm of colon: Secondary | ICD-10-CM

## 2021-05-20 DIAGNOSIS — Z0000 Encounter for general adult medical examination without abnormal findings: Secondary | ICD-10-CM

## 2021-05-20 DIAGNOSIS — E6609 Other obesity due to excess calories: Secondary | ICD-10-CM

## 2021-05-20 DIAGNOSIS — Z23 Encounter for immunization: Secondary | ICD-10-CM | POA: Diagnosis not present

## 2021-05-20 DIAGNOSIS — Z6839 Body mass index (BMI) 39.0-39.9, adult: Secondary | ICD-10-CM

## 2021-05-20 MED ORDER — ATORVASTATIN CALCIUM 10 MG PO TABS: 10.0000 mg | ORAL_TABLET | Freq: Every day | ORAL | 3 refills | Status: DC

## 2021-05-20 MED ORDER — LEVOTHYROXINE SODIUM 75 MCG PO TABS
75.0000 ug | ORAL_TABLET | Freq: Every day | ORAL | 3 refills | Status: DC
Start: 2021-05-20 — End: 2022-05-25

## 2021-05-20 NOTE — Assessment & Plan Note (Signed)
Reviewed health habits including diet and exercise and skin cancer prevention ?Reviewed appropriate screening tests for age  ?Also reviewed health mt list, fam hx and immunization status , as well as social and family history   ?See HPI ?Labs reviewed  ?prevnar 13 given ?Td given ?Mammogram due in June ?Colonoscopy utd from 2021 with 7 y recall  ?dexa ordered  ?Encouraged healthy diet and exercise and sun protection  ?

## 2021-05-20 NOTE — Patient Instructions (Addendum)
Tetanus shot and pneumonia vaccine today  ? ? ?Call to schedule your bone density test ?Schedule your mammogram for June  ? ?Please call the location of your choice from the menu below to schedule your Mammogram and/or Bone Density appointment.   ? ?[place]  ? ?[facility]                ?      Phone:  [phone] ?[address]                               ?[name], [place] [address]                                                             ?Services: Traditional and 3D Mammogram, Bone Density  ? ?[facility]           ?      Phone: [phone] ?[address]                                                       ?[place], [place] [address]    ?Service: Bone Density ONLY  ? *this site does NOT perform mammograms ? ?[place]                       ? Phone:  [phone] ?[address]                                  ?[place], [place] [address]                                            ?Services:  3D Mammogram and Bone Density  ? ? ?[place] ? ?[place] [facility]   ?Phone:  [phone]   ?[place],  [address]                                            ?Services: 3D Mammogram and Bone Density ? ?[place] at [facility])  ?Phone:  [phone]   ?[address]                        ?  [place], [place] [address]                                              ?Services:  3D Mammogram and Bone Density ? ? ? ?The 36 hour day is a good book about caring for dementia in family member  ? ? ?

## 2021-05-20 NOTE — Progress Notes (Signed)
? ?Subjective:  ? ? Patient ID: Nicole [name], female    DOB: Aug 15, 1955, 66 y.o.   MRN: [id number] ? ?HPI ?Here for health maintenance exam and to review chronic medical problems   ? ?Wt Readings from Last 3 Encounters:  ?05/20/21 227 lb 6 oz (103.1 kg)  ?12/19/20 227 lb 4 oz (103.1 kg)  ?05/19/20 227 lb 8 oz (103.2 kg)  ? ?39.03 kg/m? ?Still working  ? ?Doing well overall  ? ?Sees ortho/some swelling post knee replacement  ?Lot of walking working on [employer]  ?Has handicapped placard when needed at work  ?Loves her job  ? ?Prevnar due  (pna 23 was 2017) ?Td 04/2009 ?Flu shot 11/2020 ?Covid booster 11/2020 ?Had shingrix vaccine  ? ?Sister has early onset alzheimers  ? ? ?Mammogram 07/2020   ?Self breast exam: no lumps  ? ?Colonoscopy 07/2019 with 7 y recall ? ? ?Dexa-she went to schedule it and insurance would not cover it  ?Had a health fair at work and was told it was covered  ?Falls/fractures: none  ?Supplements : none  ?Exercise : walking a lot at work and yard work /lots of steps  ? ? ? ?Hypothyroidism  ?Pt has no clinical changes ?No change in energy level/ hair or skin/ edema and no tremor ?Lab Results  ?Component Value Date  ? TSH 2.39 05/13/2021  ?  Takes levothyroxine 75 mcg daily  ? ? ?Hyperlipidemia ?Lab Results  ?Component Value Date  ? CHOL 164 05/13/2021  ? CHOL 158 04/28/2020  ? CHOL 161 04/23/2019  ? ?Lab Results  ?Component Value Date  ? HDL 53.60 05/13/2021  ? HDL 50.20 04/28/2020  ? HDL 47.60 04/23/2019  ? ?Lab Results  ?Component Value Date  ? [place] 96 05/13/2021  ? [place] 92 04/28/2020  ? [place] 97 04/23/2019  ? ?Lab Results  ?Component Value Date  ? TRIG 74.0 05/13/2021  ? TRIG 79.0 04/28/2020  ? TRIG 86.0 04/23/2019  ? ?Lab Results  ?Component Value Date  ? CHOLHDL 3 05/13/2021  ? CHOLHDL 3 04/28/2020  ? CHOLHDL 3 04/23/2019  ? ?Lab Results  ?Component Value Date  ? LDLDIRECT 157.8 03/23/2012  ? LDLDIRECT 154.9 11/27/2009  ? LDLDIRECT 178.7 08/14/2009  ? ?Atorvastatin 10 mg daily   ?Diet is good / a bit better  ?More salads and chicken  ? ?Patient Active Problem List  ? Diagnosis Date Noted  ? 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 Estrogen deficiency 05/19/2020  ? Colon cancer screening 04/25/2019  ? Obesity 01/06/2015  ? Hypothyroid 09/17/2013  ? Encounter for routine gynecological examination 03/28/2012  ? Routine general medical examination at a health care facility 03/19/2012  ? CYST AND PSEUDOCYST OF PANCREAS 12/31/2009  ? NONSPECIFIC ABN FINDING RAD & OTH EXAM GI TRACT 04/07/2009  ? Hyperlipidemia LDL goal <130 08/02/2007  ? ECZEMA, HANDS 08/02/2007  ? ?Past Medical History:  ?Diagnosis Date  ? Hx of adenomatous colonic polyps 07/2019  ? Hyperlipidemia   ? Peripencreatic cyst 2011  ? aspirated by EUS  ? Thyroid disease   ? ?Past Surgical History:  ?Procedure Laterality Date  ? CESAREAN SECTION    ? x2  ? COLONOSCOPY  06/04/2009  ? MEDIAL PARTIAL KNEE REPLACEMENT  2017  ? UPPER ESOPHAGEAL ENDOSCOPIC ULTRASOUND (EUS)  01/15/2010  ? Dr.Jacobs  ? ?Social History  ? ?Tobacco Use  ? Smoking status: Never  ? Smokeless tobacco: Never  ?Vaping Use  ? Vaping Use: Never used  ?Substance Use Topics  ?  Alcohol use: Yes  ?  Alcohol/week: 2.0 standard drinks  ?  Types: 2 Glasses of wine per week  ?  Comment: 1-2 glasses of wine a week  ? Drug use: No  ? ?Family History  ?Problem Relation Age of Onset  ? Uterine cancer Mother   ? Alcoholism Mother   ? Lymphoma Father   ? Heart attack Father   ? Aneurysm Sister   ? Colon cancer Neg Hx   ? Colon polyps Neg Hx   ? Esophageal cancer Neg Hx   ? Rectal cancer Neg Hx   ? Stomach cancer Neg Hx   ? ?No Known Allergies ?Current Outpatient Medications on File Prior to Visit  ?Medication Sig Dispense Refill  ? VITAMIN D PO Take by mouth.    ? ?No current facility-administered medications on file prior to visit.  ?  ?Review of Systems  ?Constitutional:  Negative for activity change, appetite change, fatigue, fever and unexpected weight change.  ?HENT:  Negative for  congestion, ear pain, rhinorrhea, sinus pressure and sore throat.   ?Eyes:  Negative for pain, redness and visual disturbance.  ?Respiratory:  Negative for cough, shortness of breath and wheezing.   ?Cardiovascular:  Negative for chest pain and palpitations.  ?Gastrointestinal:  Negative for abdominal pain, blood in stool, constipation and diarrhea.  ?Endocrine: Negative for polydipsia and polyuria.  ?Genitourinary:  Negative for dysuria, frequency and urgency.  ?Musculoskeletal:  Positive for arthralgias. Negative for back pain and myalgias.  ?     Occ knee swelling  ?Skin:  Negative for pallor and rash.  ?Allergic/Immunologic: Negative for environmental allergies.  ?Neurological:  Negative for dizziness, syncope and headaches.  ?Hematological:  Negative for adenopathy. Does not bruise/bleed easily.  ?Psychiatric/Behavioral:  Negative for decreased concentration and dysphoric mood. The patient is not nervous/anxious.   ? ?   ?Objective:  ? Physical Exam ?Constitutional:   ?   General: She is not in acute distress. ?   Appearance: Normal appearance. She is well-developed. She is obese. She is not ill-appearing or diaphoretic.  ?HENT:  ?   Head: Normocephalic and atraumatic.  ?   Right Ear: Tympanic membrane, ear canal and external ear normal.  ?   Left Ear: Tympanic membrane, ear canal and external ear normal.  ?   Nose: Nose normal. No congestion.  ?   Mouth/Throat:  ?   Mouth: Mucous membranes are moist.  ?   Pharynx: Oropharynx is clear. No posterior oropharyngeal erythema.  ?Eyes:  ?   General: No scleral icterus. ?   Extraocular Movements: Extraocular movements intact.  ?   Conjunctiva/sclera: Conjunctivae normal.  ?   Pupils: Pupils are equal, round, and reactive to light.  ?Neck:  ?   Thyroid: No thyromegaly.  ?   Vascular: No carotid bruit or JVD.  ?Cardiovascular:  ?   Rate and Rhythm: Normal rate and regular rhythm.  ?   Pulses: Normal pulses.  ?   Heart sounds: Normal heart sounds.  ?  No gallop.   ?Pulmonary:  ?   Effort: Pulmonary effort is normal. No respiratory distress.  ?   Breath sounds: Normal breath sounds. No wheezing.  ?   Comments: Good air exch ?Chest:  ?   Chest wall: No tenderness.  ?Abdominal:  ?   General: Bowel sounds are normal. There is no distension or abdominal bruit.  ?   Palpations: Abdomen is soft. There is no mass.  ?   Tenderness: There is no  abdominal tenderness.  ?   Hernia: No hernia is present.  ?Genitourinary: ?   Comments: Breast exam: No mass, nodules, thickening, tenderness, bulging, retraction, inflamation, nipple discharge or skin changes noted.  No axillary or clavicular LA.     ?Musculoskeletal:     ?   General: No tenderness. Normal range of motion.  ?   Cervical back: Normal range of motion and neck supple. No rigidity. No muscular tenderness.  ?   Right lower leg: No edema.  ?   Left lower leg: No edema.  ?   Comments: No kyphosis   ?Lymphadenopathy:  ?   Cervical: No cervical adenopathy.  ?Skin: ?   General: Skin is warm and dry.  ?   Coloration: Skin is not pale.  ?   Findings: No erythema or rash.  ?   Comments: Solar lentigines diffusely ?  ?Neurological:  ?   Mental Status: She is alert. Mental status is at baseline.  ?   Cranial Nerves: No cranial nerve deficit.  ?   Motor: No abnormal muscle tone.  ?   Coordination: Coordination normal.  ?   Gait: Gait normal.  ?   Deep Tendon Reflexes: Reflexes are normal and symmetric. Reflexes normal.  ?Psychiatric:     ?   Mood and Affect: Mood normal.     ?   Cognition and Memory: Cognition and memory normal.  ? ? ? ? ? ?   ?Assessment & Plan:  ? ?Problem List Items Addressed This Visit   ? ?  ? Endocrine  ? Hypothyroid  ?  Hypothyroidism  ?Pt has no clinical changes ?No change in energy level/ hair or skin/ edema and no tremor ?Lab Results  ?Component Value Date  ? TSH 2.39 05/13/2021  ?  Plan to continue levothyroxine 75 mcg daily ?  ?  ? Relevant Medications  ? levothyroxine (EUTHYROX) 75 MCG tablet  ?  ? Other  ? Colon  cancer screening  ?  colonscopy 07/2019 with 7 y recall  ?Up to date ? ?  ?  ? Estrogen deficiency  ? Relevant Orders  ? DG Bone Density  ? Hyperlipidemia LDL goal <130  ?  Disc goals for lipids and reasons to

## 2021-05-20 NOTE — Telephone Encounter (Signed)
When I went to give pt her vaccines she said she forgot to ask PCP one question. Pt asked if she is due for a pap smear and if not when does PCP think she needs another one. Pt's not having any vaginal issues she just knows it's been a while since she has had one. If she is due for one now she is okay waiting till next year's CPE unless PCP thinks she needs one right now ?

## 2021-05-20 NOTE — Assessment & Plan Note (Signed)
Discussed how this problem influences overall health and the risks it imposes  Reviewed plan for weight loss with lower calorie diet (via better food choices and also portion control or program like weight watchers) and exercise building up to or more than 30 minutes 5 days per week including some aerobic activity    

## 2021-05-20 NOTE — Assessment & Plan Note (Signed)
Hypothyroidism  ?Pt has no clinical changes ?No change in energy level/ hair or skin/ edema and no tremor ?Lab Results  ?Component Value Date  ? TSH 2.39 05/13/2021  ?  Plan to continue levothyroxine 75 mcg daily ?

## 2021-05-20 NOTE — Assessment & Plan Note (Signed)
Disc goals for lipids and reasons to control them ?Rev last labs with pt ?Rev low sat fat diet in detail ? ?>DL of 96, stable ?Plan to continue atorvastatin 10 mg daily and work on diet  ?

## 2021-05-20 NOTE — Assessment & Plan Note (Signed)
colonscopy 07/2019 with 7 y recall  ?Up to date ? ?

## 2021-05-21 NOTE — Telephone Encounter (Signed)
I don't routinely do paps after 65.  Her last pap was 2018 (normal with neg HPV)  ?In that case a pap is recommended every 5 years anyway.     If no symptoms and no new partners then she is low risk.   If she wants to do a gyn exam please follow up, otherwise we can discuss it the next time she is here.  ? ? ? ?

## 2021-05-21 NOTE — Telephone Encounter (Signed)
Mychart sent to pt: ? ?Dr. Marliss Coots response to when you are due for a PAP: ?"I don't routinely do paps after 65.  Her last pap was 2018 (normal with neg HPV)  ?In that case a pap is recommended every 5 years anyway.   If no symptoms and no new partners then she is low risk.   If she wants to do a gyn exam please follow up, otherwise we can discuss it the next time she is here." ?  ?If you have any other questions please let me know.  ?  ?Thanks, ?Christy  ?

## 2021-05-25 ENCOUNTER — Other Ambulatory Visit: Payer: Self-pay | Admitting: Family Medicine

## 2021-05-25 DIAGNOSIS — Z1231 Encounter for screening mammogram for malignant neoplasm of breast: Secondary | ICD-10-CM

## 2021-06-30 ENCOUNTER — Encounter: Payer: Self-pay | Admitting: Family Medicine

## 2021-06-30 ENCOUNTER — Ambulatory Visit
Admission: RE | Admit: 2021-06-30 | Discharge: 2021-06-30 | Disposition: A | Discharge status: Home / Self Care | Payer: No Typology Code available for payment source | Source: Ambulatory Visit | Attending: Family Medicine | Admitting: Family Medicine

## 2021-06-30 DIAGNOSIS — E2839 Other primary ovarian failure: Secondary | ICD-10-CM

## 2021-06-30 DIAGNOSIS — M8580 Other specified disorders of bone density and structure, unspecified site: Secondary | ICD-10-CM | POA: Insufficient documentation

## 2021-07-27 ENCOUNTER — Ambulatory Visit: Payer: No Typology Code available for payment source

## 2021-07-29 ENCOUNTER — Ambulatory Visit: Payer: No Typology Code available for payment source

## 2021-08-03 ENCOUNTER — Ambulatory Visit
Admission: RE | Admit: 2021-08-03 | Discharge: 2021-08-03 | Disposition: A | Discharge status: Home / Self Care | Payer: No Typology Code available for payment source | Source: Ambulatory Visit | Attending: Family Medicine | Admitting: Family Medicine

## 2021-08-03 DIAGNOSIS — Z1231 Encounter for screening mammogram for malignant neoplasm of breast: Secondary | ICD-10-CM

## 2021-08-31 ENCOUNTER — Encounter: Payer: Self-pay | Admitting: Family Medicine

## 2021-10-13 ENCOUNTER — Encounter: Payer: Self-pay | Admitting: Family Medicine

## 2021-10-13 NOTE — Telephone Encounter (Signed)
See other mychart message pt sent, form in your inbox

## 2021-10-13 NOTE — Telephone Encounter (Signed)
Pt came in office to drop off paper work for your review . Stated need screening code for billing. Please advise #[phone]

## 2021-10-13 NOTE — Telephone Encounter (Signed)
Form in your inbox 

## 2021-10-20 NOTE — Telephone Encounter (Signed)
Called [facility] at [phone] In reference to coding change needed for DOS: 5.16.23  Spoke with Jede J. In billing at [place]  The account is being sent for review, could take 30-45 business days to process the corrected claim with Z13.820 screening, not diagnostic code

## 2021-12-08 NOTE — Telephone Encounter (Signed)
Patient called in and would ike the status on the process. She would like to know if the corrected codes have been changed and sent to the imaging center for her bone density test?

## 2021-12-23 NOTE — Telephone Encounter (Signed)
Do you know who I can follow up with this for?

## 2022-01-22 ENCOUNTER — Telehealth: Payer: Self-pay | Admitting: Family Medicine

## 2022-01-22 NOTE — Telephone Encounter (Signed)
Patient called to speak with [place] CMA on going issue about billing and coding and its been going on since September. Also to speak with office manager about the issue.

## 2022-01-22 NOTE — Telephone Encounter (Signed)
I think this is re: charge for a bone density test.  I will send to [place] when she returns and also management and I think Dawn Pollie Friar wat also helping out  Thanks

## 2022-01-25 NOTE — Telephone Encounter (Signed)
I do not handle billing issues so will let management f/u on this

## 2022-01-25 NOTE — Telephone Encounter (Signed)
Nicole [name] is out this week, will send to [place] and Amy

## 2022-05-17 ENCOUNTER — Telehealth: Payer: Self-pay | Admitting: Family Medicine

## 2022-05-17 DIAGNOSIS — Z0000 Encounter for general adult medical examination without abnormal findings: Secondary | ICD-10-CM

## 2022-05-17 DIAGNOSIS — Z131 Encounter for screening for diabetes mellitus: Secondary | ICD-10-CM

## 2022-05-17 DIAGNOSIS — E785 Hyperlipidemia, unspecified: Secondary | ICD-10-CM

## 2022-05-17 DIAGNOSIS — E039 Hypothyroidism, unspecified: Secondary | ICD-10-CM

## 2022-05-17 DIAGNOSIS — E6609 Other obesity due to excess calories: Secondary | ICD-10-CM

## 2022-05-17 DIAGNOSIS — R7303 Prediabetes: Secondary | ICD-10-CM | POA: Insufficient documentation

## 2022-05-17 NOTE — Telephone Encounter (Signed)
-----   Message from Ellamae Sia sent at 05/04/2022 10:58 AM EDT ----- Regarding: Lab orders for Tuesday, 4.2.24 Patient is scheduled for CPX labs, please order future labs, Thanks , Karna Christmas

## 2022-05-18 ENCOUNTER — Other Ambulatory Visit (INDEPENDENT_AMBULATORY_CARE_PROVIDER_SITE_OTHER): Payer: No Typology Code available for payment source

## 2022-05-18 DIAGNOSIS — Z0000 Encounter for general adult medical examination without abnormal findings: Secondary | ICD-10-CM | POA: Diagnosis not present

## 2022-05-18 DIAGNOSIS — Z6839 Body mass index (BMI) 39.0-39.9, adult: Secondary | ICD-10-CM

## 2022-05-18 DIAGNOSIS — E6609 Other obesity due to excess calories: Secondary | ICD-10-CM | POA: Diagnosis not present

## 2022-05-18 DIAGNOSIS — E039 Hypothyroidism, unspecified: Secondary | ICD-10-CM

## 2022-05-18 DIAGNOSIS — Z131 Encounter for screening for diabetes mellitus: Secondary | ICD-10-CM

## 2022-05-18 DIAGNOSIS — E785 Hyperlipidemia, unspecified: Secondary | ICD-10-CM | POA: Diagnosis not present

## 2022-05-18 LAB — LIPID PANEL
Cholesterol: 141 mg/dL (ref 0–200)
HDL: 40 mg/dL (ref >39.00)
LDL Cholesterol: 84 mg/dL (ref 0–99)
NonHDL: 100.82
Total CHOL/HDL Ratio: 4
Triglycerides: 84 mg/dL (ref 0.0–149.0)
VLDL: 16.8 mg/dL (ref 0.0–40.0)

## 2022-05-18 LAB — CBC WITH DIFFERENTIAL/PLATELET
Basophils Absolute: 0 10*3/uL (ref 0.0–0.1)
Basophils Relative: 0.6 % (ref 0.0–3.0)
Eosinophils Absolute: 0.2 10*3/uL (ref 0.0–0.7)
Eosinophils Relative: 2.7 % (ref 0.0–5.0)
HCT: 40.9 % (ref 36.0–46.0)
Hemoglobin: 14 g/dL (ref 12.0–15.0)
Lymphocytes Relative: 29.4 % (ref 12.0–46.0)
Lymphs Abs: 1.8 10*3/uL (ref 0.7–4.0)
MCHC: 34.3 g/dL (ref 30.0–36.0)
MCV: 87.7 fl (ref 78.0–100.0)
Monocytes Absolute: 0.5 10*3/uL (ref 0.1–1.0)
Monocytes Relative: 8.1 % (ref 3.0–12.0)
Neutro Abs: 3.6 10*3/uL (ref 1.4–7.7)
Neutrophils Relative %: 59.2 % (ref 43.0–77.0)
Platelets: 201 10*3/uL (ref 150.0–400.0)
RBC: 4.67 Mil/uL (ref 3.87–5.11)
RDW: 13.6 % (ref 11.5–15.5)
WBC: 6 10*3/uL (ref 4.0–10.5)

## 2022-05-18 LAB — COMPREHENSIVE METABOLIC PANEL
ALT: 22 U/L (ref 0–35)
AST: 24 U/L (ref 0–37)
Albumin: 4.1 g/dL (ref 3.5–5.2)
Alkaline Phosphatase: 88 U/L (ref 39–117)
BUN: 13 mg/dL (ref 6–23)
CO2: 28 mEq/L (ref 19–32)
Calcium: 9.8 mg/dL (ref 8.4–10.5)
Chloride: 106 mEq/L (ref 96–112)
Creatinine, Ser: 0.83 mg/dL (ref 0.40–1.20)
GFR: 72.97 mL/min (ref >60.00)
Glucose, Bld: 96 mg/dL (ref 70–99)
Potassium: 4.1 mEq/L (ref 3.5–5.1)
Sodium: 139 mEq/L (ref 135–145)
Total Bilirubin: 0.5 mg/dL (ref 0.2–1.2)
Total Protein: 6.5 g/dL (ref 6.0–8.3)

## 2022-05-18 LAB — HEMOGLOBIN A1C: Hgb A1c MFr Bld: 5.9 % (ref 4.6–6.5)

## 2022-05-18 LAB — TSH: TSH: 1.2 u[IU]/mL (ref 0.35–5.50)

## 2022-05-25 ENCOUNTER — Encounter: Payer: Self-pay | Admitting: Family Medicine

## 2022-05-25 ENCOUNTER — Encounter: Payer: No Typology Code available for payment source | Admitting: Family Medicine

## 2022-05-25 ENCOUNTER — Ambulatory Visit (INDEPENDENT_AMBULATORY_CARE_PROVIDER_SITE_OTHER): Payer: No Typology Code available for payment source | Admitting: Family Medicine

## 2022-05-25 VITALS — BP 124/70 | HR 76 | Temp 97.8°F | Ht 64.0 in | Wt 218.5 lb

## 2022-05-25 DIAGNOSIS — E785 Hyperlipidemia, unspecified: Secondary | ICD-10-CM | POA: Diagnosis not present

## 2022-05-25 DIAGNOSIS — Z0000 Encounter for general adult medical examination without abnormal findings: Secondary | ICD-10-CM

## 2022-05-25 DIAGNOSIS — M8588 Other specified disorders of bone density and structure, other site: Secondary | ICD-10-CM

## 2022-05-25 DIAGNOSIS — E66812 Obesity, class 2: Secondary | ICD-10-CM

## 2022-05-25 DIAGNOSIS — Z6837 Body mass index (BMI) 37.0-37.9, adult: Secondary | ICD-10-CM

## 2022-05-25 DIAGNOSIS — E039 Hypothyroidism, unspecified: Secondary | ICD-10-CM

## 2022-05-25 DIAGNOSIS — R7303 Prediabetes: Secondary | ICD-10-CM

## 2022-05-25 DIAGNOSIS — Z1211 Encounter for screening for malignant neoplasm of colon: Secondary | ICD-10-CM

## 2022-05-25 DIAGNOSIS — E6609 Other obesity due to excess calories: Secondary | ICD-10-CM

## 2022-05-25 MED ORDER — LEVOTHYROXINE SODIUM 75 MCG PO TABS: 75.0000 ug | ORAL_TABLET | Freq: Every day | ORAL | 3 refills | Status: DC

## 2022-05-25 MED ORDER — ATORVASTATIN CALCIUM 10 MG PO TABS: 10.0000 mg | ORAL_TABLET | Freq: Every day | ORAL | 3 refills | Status: DC

## 2022-05-25 NOTE — Patient Instructions (Addendum)
Keep walking   Add some strength training to your routine, this is important for bone and brain health and can reduce your risk of falls and help your body use insulin properly and regulate weight  Light weights, exercise bands , and internet videos are a good way to start  Yoga (chair or regular), machines , floor exercises or a gym with machines are also good options   For diabetes prevention  Try to get most of your carbohydrates from produce (with the exception of white potatoes)  Eat less bread/pasta/rice/snack foods/cereals/sweets and other items from the middle of the grocery store (processed carbs)   Keep taking care of yourself !

## 2022-05-25 NOTE — Assessment & Plan Note (Signed)
Disc goals for lipids and reasons to control them Rev last labs with pt Rev low sat fat diet in detail Continues atorvastatin 10 mg daily  Diet is good  LDL down  HDL also down-enc her to keep exercising  Omega 3 sources may be good also

## 2022-05-25 NOTE — Assessment & Plan Note (Signed)
Discussed how this problem influences overall health and the risks it imposes  Reviewed plan for weight loss with lower calorie diet (via better food choices and also portion control or program like weight watchers) and exercise building up to or more than 30 minutes 5 days per week including some aerobic activity    Enc some strength training  Enc low glycemic diet

## 2022-05-25 NOTE — Assessment & Plan Note (Signed)
Reviewed health habits including diet and exercise and skin cancer prevention Reviewed appropriate screening tests for age  Also reviewed health mt list, fam hx and immunization status , as well as social and family history   See HPI Labs reviewed and ordered  Mammogram 07/2021 -due in June Colonoscopy utd 07/2019 with 7 y recall Dexa 06/2021 utd , no falls or fx  PHQ score of 0

## 2022-05-25 NOTE — Assessment & Plan Note (Signed)
Dexa reviewed 06/2021- had difficulty getting ins to cover this  No falls or fx  Taking vit D Very active Enc her to add more strength training if possible   Can re check at 1 y

## 2022-05-25 NOTE — Assessment & Plan Note (Signed)
Hypothyroidism  Pt has no clinical changes No change in energy level/ hair or skin/ edema and no tremor Lab Results  Component Value Date   TSH 1.20 05/18/2022    Plan to continue levothyroxine 75 mcg daily

## 2022-05-25 NOTE — Assessment & Plan Note (Signed)
Lab Results  Component Value Date   HGBA1C 5.9 05/18/2022   This is in the prediabetes range disc imp of low glycemic diet and wt loss to prevent DM2

## 2022-05-25 NOTE — Assessment & Plan Note (Signed)
Colonoscopy 07/2019 with 7 y recall

## 2022-05-25 NOTE — Progress Notes (Signed)
Subjective:    Patient ID: Nicole [name], female    DOB: 12/24/1955, 67 y.o.   MRN: [id number]  HPI Here for health maintenance exam and to review chronic medical problems    Wt Readings from Last 3 Encounters:  05/25/22 218 lb 8 oz (99.1 kg)  05/20/21 227 lb 6 oz (103.1 kg)  12/19/20 227 lb 4 oz (103.1 kg)   37.51 kg/m  Doing some protein shakes  Lost some weight     Vitals:   05/25/22 1446  BP: 124/70  Pulse: 76  Temp: 97.8 F (36.6 C)  SpO2: 97%    Immunization History  Administered Date(s) Administered   Influenza Inj Mdck Quad Pf 12/11/2017   Influenza Whole 11/15/2005   Influenza, Seasonal, Injecte, Preservative Fre 12/06/2015   Influenza-Unspecified 11/15/2012, 11/16/2014, 10/18/2018, 10/27/2019, 11/27/2020   PFIZER(Purple Top)SARS-COV-2 Vaccination 04/19/2019, 05/17/2019, 11/27/2019   Pfizer Covid-19 Vaccine Bivalent Booster 95yrs & up 11/27/2020   Pneumococcal Conjugate-13 05/20/2021   Pneumococcal Polysaccharide-23 04/30/2015   Td 12/23/1997, 04/15/2009, 05/20/2021   Zoster Recombinat (Shingrix) 07/06/2019, 09/05/2019   Zoster, Live 04/02/2015   There are no preventive care reminders to display for this patient.  Mammogram 07/2021  Self breast exam -no lumps   Colonoscopy 07/2019 with 7 y recall   Dexa  06/2021  mild osteopenia  Pap 2018 nl with neg HPV   Mother had uterine cancer   Falls-none  Fractures- none  Supplements - vit D  Exercise - mostly walking  (knee feels better)  Has some weights at home  Very active- takes care of 9 acres     Mood    05/25/2022    2:57 PM 05/19/2020    8:43 AM 05/19/2020    7:59 AM 04/25/2019    8:52 AM 04/24/2018    8:54 AM  Depression screen PHQ 2/9  Decreased Interest 0 0 0 0 0  Down, Depressed, Hopeless 0 0 0 0 0  PHQ - 2 Score 0 0 0 0 0  Altered sleeping 0 0  0   Tired, decreased energy 0 0  0   Change in appetite 0 0  0   Feeling bad or failure about yourself  0 0  0   Trouble concentrating 0  0  0   Moving slowly or fidgety/restless 0 0  0   Suicidal thoughts 0 0  0   PHQ-9 Score 0 0  0   Difficult doing work/chores Not difficult at all Not difficult at all  Not difficult at all      Hypothyroidism  Pt has no clinical changes No change in energy level/ hair or skin/ edema and no tremor Lab Results  Component Value Date   TSH 1.20 05/18/2022    Levothyroxine 75 mcg daily       Hyperlipidemia Lab Results  Component Value Date   CHOL 141 05/18/2022   CHOL 164 05/13/2021   CHOL 158 04/28/2020   Lab Results  Component Value Date   HDL 40.00 05/18/2022   HDL 53.60 05/13/2021   HDL 50.20 04/28/2020   Lab Results  Component Value Date   LDLCALC 84 05/18/2022   LDLCALC 96 05/13/2021   LDLCALC 92 04/28/2020   Lab Results  Component Value Date   TRIG 84.0 05/18/2022   TRIG 74.0 05/13/2021   TRIG 79.0 04/28/2020   Lab Results  Component Value Date   CHOLHDL 4 05/18/2022   CHOLHDL 3 05/13/2021   CHOLHDL 3 04/28/2020  Lab Results  Component Value Date   LDLDIRECT 157.8 03/23/2012   LDLDIRECT 154.9 11/27/2009   LDLDIRECT 178.7 08/14/2009   Atorvastatin 10 mg daily   Eating low sat/trans fat  Eating better overall  LDL is improved  HDL is down a bit also     DM screening Lab Results  Component Value Date   HGBA1C 5.9 05/18/2022   Gluocse 96  Lab Results  Component Value Date   WBC 6.0 05/18/2022   HGB 14.0 05/18/2022   HCT 40.9 05/18/2022   MCV 87.7 05/18/2022   PLT 201.0 05/18/2022   Lab Results  Component Value Date   CREATININE 0.83 05/18/2022   BUN 13 05/18/2022   NA 139 05/18/2022   K 4.1 05/18/2022   CL 106 05/18/2022   CO2 28 05/18/2022   Lab Results  Component Value Date   ALT 22 05/18/2022   AST 24 05/18/2022   ALKPHOS 88 05/18/2022   BILITOT 0.5 05/18/2022     Patient Active Problem List   Diagnosis Date Noted   Prediabetes 05/17/2022   Osteopenia 06/30/2021   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   Estrogen  deficiency 05/19/2020   Colon cancer screening 04/25/2019   Obesity 01/06/2015   Hypothyroid 09/17/2013   Encounter for routine gynecological examination 03/28/2012   Routine general medical examination at a health care facility 03/19/2012   CYST AND PSEUDOCYST OF PANCREAS 12/31/2009   NONSPECIFIC ABN FINDING RAD & OTH EXAM GI TRACT 04/07/2009   Hyperlipidemia LDL goal <130 08/02/2007   ECZEMA, HANDS 08/02/2007   Past Medical History:  Diagnosis Date   Hx of adenomatous colonic polyps 07/2019   Hyperlipidemia    Peripencreatic cyst 2011   aspirated by EUS   Thyroid disease    Past Surgical History:  Procedure Laterality Date   CESAREAN SECTION     x2   COLONOSCOPY  06/04/2009   MEDIAL PARTIAL KNEE REPLACEMENT  2017   UPPER ESOPHAGEAL ENDOSCOPIC ULTRASOUND (EUS)  01/15/2010   Dr.Jacobs   Social History   Tobacco Use   Smoking status: Never   Smokeless tobacco: Never  Vaping Use   Vaping Use: Never used  Substance Use Topics   Alcohol use: Yes    Alcohol/week: 2.0 standard drinks of alcohol    Types: 2 Glasses of wine per week    Comment: 1-2 glasses of wine a week   Drug use: No   Family History  Problem Relation Age of Onset   Uterine cancer Mother    Alcoholism Mother    Lymphoma Father    Heart attack Father    Aneurysm Sister    Colon cancer Neg Hx    Colon polyps Neg Hx    Esophageal cancer Neg Hx    Rectal cancer Neg Hx    Stomach cancer Neg Hx    No Known Allergies Current Outpatient Medications on File Prior to Visit  Medication Sig Dispense Refill   VITAMIN D PO Take by mouth.     No current facility-administered medications on file prior to visit.     Review of Systems  Constitutional:  Negative for activity change, appetite change, fatigue, fever and unexpected weight change.  HENT:  Negative for congestion, ear pain, rhinorrhea, sinus pressure and sore throat.   Eyes:  Negative for pain, redness and visual disturbance.  Respiratory:   Negative for cough, shortness of breath and wheezing.   Cardiovascular:  Negative for chest pain and palpitations.  Gastrointestinal:  Negative for abdominal  pain, blood in stool, constipation and diarrhea.  Endocrine: Negative for polydipsia and polyuria.  Genitourinary:  Negative for dysuria, frequency and urgency.  Musculoskeletal:  Negative for arthralgias, back pain and myalgias.  Skin:  Negative for pallor and rash.  Allergic/Immunologic: Negative for environmental allergies.  Neurological:  Negative for dizziness, syncope and headaches.  Hematological:  Negative for adenopathy. Does not bruise/bleed easily.  Psychiatric/Behavioral:  Negative for decreased concentration and dysphoric mood. The patient is not nervous/anxious.        Objective:   Physical Exam Constitutional:      General: She is not in acute distress.    Appearance: Normal appearance. She is well-developed. She is obese. She is not ill-appearing or diaphoretic.  HENT:     Head: Normocephalic and atraumatic.     Right Ear: Tympanic membrane, ear canal and external ear normal.     Left Ear: Tympanic membrane, ear canal and external ear normal.     Nose: Nose normal. No congestion.     Mouth/Throat:     Mouth: Mucous membranes are moist.     Pharynx: Oropharynx is clear. No posterior oropharyngeal erythema.  Eyes:     General: No scleral icterus.    Extraocular Movements: Extraocular movements intact.     Conjunctiva/sclera: Conjunctivae normal.     Pupils: Pupils are equal, round, and reactive to light.  Neck:     Thyroid: No thyromegaly.     Vascular: No carotid bruit or JVD.  Cardiovascular:     Rate and Rhythm: Normal rate and regular rhythm.     Pulses: Normal pulses.     Heart sounds: Normal heart sounds.     No gallop.  Pulmonary:     Effort: Pulmonary effort is normal. No respiratory distress.     Breath sounds: Normal breath sounds. No wheezing.     Comments: Good air exch Chest:     Chest  wall: No tenderness.  Abdominal:     General: Bowel sounds are normal. There is no distension or abdominal bruit.     Palpations: Abdomen is soft. There is no mass.     Tenderness: There is no abdominal tenderness.     Hernia: No hernia is present.  Genitourinary:    Comments: Breast exam: No mass, nodules, thickening, tenderness, bulging, retraction, inflamation, nipple discharge or skin changes noted.  No axillary or clavicular LA.     Musculoskeletal:        General: No tenderness. Normal range of motion.     Cervical back: Normal range of motion and neck supple. No rigidity. No muscular tenderness.     Right lower leg: No edema.     Left lower leg: No edema.     Comments: No kyphosis   Lymphadenopathy:     Cervical: No cervical adenopathy.  Skin:    General: Skin is warm and dry.     Coloration: Skin is not pale.     Findings: No erythema or rash.     Comments: Solar lentigines diffusely   Neurological:     Mental Status: She is alert. Mental status is at baseline.     Cranial Nerves: No cranial nerve deficit.     Motor: No abnormal muscle tone.     Coordination: Coordination normal.     Gait: Gait normal.     Deep Tendon Reflexes: Reflexes are normal and symmetric. Reflexes normal.  Psychiatric:        Mood and Affect: Mood normal.  Cognition and Memory: Cognition and memory normal.           Assessment & Plan:   Problem List Items Addressed This Visit       Endocrine   Hypothyroid    Hypothyroidism  Pt has no clinical changes No change in energy level/ hair or skin/ edema and no tremor Lab Results  Component Value Date   TSH 1.20 05/18/2022    Plan to continue levothyroxine 75 mcg daily      Relevant Medications   levothyroxine (EUTHYROX) 75 MCG tablet     Musculoskeletal and Integument   Osteopenia    Dexa reviewed 06/2021- had difficulty getting ins to cover this  No falls or fx  Taking vit D Very active Enc her to add more strength  training if possible   Can re check at 2 y         Other   Colon cancer screening    Colonoscopy 07/2019 with 7 y recall      Hyperlipidemia LDL goal <130    Disc goals for lipids and reasons to control them Rev last labs with pt Rev low sat fat diet in detail Continues atorvastatin 10 mg daily  Diet is good  LDL down  HDL also down-enc her to keep exercising  Omega 3 sources may be good also      Relevant Medications   atorvastatin (LIPITOR) 10 MG tablet   Obesity    Discussed how this problem influences overall health and the risks it imposes  Reviewed plan for weight loss with lower calorie diet (via better food choices and also portion control or program like weight watchers) and exercise building up to or more than 30 minutes 5 days per week including some aerobic activity    Enc some strength training  Enc low glycemic diet       Prediabetes    Lab Results  Component Value Date   HGBA1C 5.9 05/18/2022  This is in the prediabetes range disc imp of low glycemic diet and wt loss to prevent DM2       Routine general medical examination at a health care facility - Primary    Reviewed health habits including diet and exercise and skin cancer prevention Reviewed appropriate screening tests for age  Also reviewed health mt list, fam hx and immunization status , as well as social and family history   See HPI Labs reviewed and ordered  Mammogram 07/2021 -due in June Colonoscopy utd 07/2019 with 7 y recall Dexa 06/2021 utd , no falls or fx  PHQ score of 0

## 2022-06-24 ENCOUNTER — Encounter: Payer: Self-pay | Admitting: Family Medicine

## 2022-06-24 NOTE — Telephone Encounter (Signed)
Printed and placed in your box for review

## 2022-06-25 NOTE — Telephone Encounter (Signed)
Tried to fax it twice and didn't go through will try to re-fax on Monday and if it still doesn't go through I will f/u with pt

## 2022-07-05 ENCOUNTER — Other Ambulatory Visit: Payer: Self-pay | Admitting: Family Medicine

## 2022-07-05 DIAGNOSIS — Z1231 Encounter for screening mammogram for malignant neoplasm of breast: Secondary | ICD-10-CM

## 2022-08-05 ENCOUNTER — Ambulatory Visit
Admission: RE | Admit: 2022-08-05 | Discharge: 2022-08-05 | Disposition: A | Discharge status: Home / Self Care | Payer: No Typology Code available for payment source | Source: Ambulatory Visit | Attending: Family Medicine | Admitting: Family Medicine

## 2022-08-05 DIAGNOSIS — Z1231 Encounter for screening mammogram for malignant neoplasm of breast: Secondary | ICD-10-CM

## 2022-08-23 ENCOUNTER — Ambulatory Visit (INDEPENDENT_AMBULATORY_CARE_PROVIDER_SITE_OTHER): Payer: No Typology Code available for payment source | Admitting: Primary Care

## 2022-08-23 ENCOUNTER — Ambulatory Visit
Admission: RE | Admit: 2022-08-23 | Discharge: 2022-08-23 | Disposition: A | Discharge status: Home / Self Care | Payer: No Typology Code available for payment source | Source: Ambulatory Visit | Attending: Primary Care

## 2022-08-23 ENCOUNTER — Encounter: Payer: Self-pay | Admitting: Primary Care

## 2022-08-23 VITALS — BP 134/76 | HR 75 | Temp 97.3°F | Ht 64.0 in | Wt 220.0 lb

## 2022-08-23 DIAGNOSIS — M546 Pain in thoracic spine: Secondary | ICD-10-CM | POA: Diagnosis not present

## 2022-08-23 MED ORDER — CYCLOBENZAPRINE HCL 5 MG PO TABS
5.0000 mg | ORAL_TABLET | Freq: Every evening | ORAL | 0 refills | Status: DC | PRN
Start: 2022-08-23 — End: 2023-03-22

## 2022-08-23 NOTE — Progress Notes (Signed)
Subjective:    Patient ID: Nicole [name], female    DOB: [birth year], 67 y.o.   MRN: [id number]  Back Pain Pertinent negatives include no chest pain or numbness.    Nicole [name] is a very pleasant 67 y.o. female patient of Dr. Milinda Antis with a history of osteopenia, prediabetes, hyperlipidemia, hypothyroidism who presents today to discuss back pain.  Symptom onset about 6 weeks ago with right flank/thoracic back pain. At the time she was coming down from a ladder in her garage and hit an object.  A few days after descending down the ladder she's noticed constant, dull, ache since. Her pain is worse when getting up in the morning after laying onto her right side, also when twisting to the left side. She feels stiff and achy mostly in the morning, improved throughout the day.  She recently took a trip to [place] and noticed increased pain during the car ride.  She denies radiation of pain, numbness/tingling, shortness of breath, dysuria, hematuria, difficulty urinating.   Her last bone density scan was in May 2023, mild osteopenia.    Review of Systems  Respiratory:  Negative for shortness of breath.   Cardiovascular:  Negative for chest pain.  Musculoskeletal:  Positive for back pain and myalgias.  Skin:  Negative for color change.  Neurological:  Negative for numbness.         Past Medical History:  Diagnosis Date   Hx of adenomatous colonic polyps 07/2019   Hyperlipidemia    Peripencreatic cyst 2011   aspirated by EUS   Thyroid disease     Social History   Socioeconomic History   Marital status: Married    Spouse name: Not on file   Number of children: Not on file   Years of education: Not on file   Highest education level: Master's degree (e.g., MA, MS, MEng, MEd, MSW, MBA)  Occupational History   Not on file  Tobacco Use   Smoking status: Never   Smokeless tobacco: Never  Vaping Use   Vaping Use: Never used  Substance and Sexual Activity    Alcohol use: Yes    Alcohol/week: 2.0 standard drinks of alcohol    Types: 2 Glasses of wine per week    Comment: 1-2 glasses of wine a week   Drug use: No   Sexual activity: Not on file  Other Topics Concern   Not on file  Social History Narrative   Not on file   Social Determinants of Health   Financial Resource Strain: Low Risk  (08/19/2022)   Overall Financial Resource Strain (CARDIA)    Difficulty of Paying Living Expenses: Not hard at all  Food Insecurity: No Food Insecurity (08/19/2022)   Hunger Vital Sign    Worried About Running Out of Food in the Last Year: Never true    Ran Out of Food in the Last Year: Never true  Transportation Needs: No Transportation Needs (08/19/2022)   PRAPARE - Administrator, Civil Service (Medical): No    Lack of Transportation (Non-Medical): No  Physical Activity: Sufficiently Active (08/19/2022)   Exercise Vital Sign    Days of Exercise per Week: 5 days    Minutes of Exercise per Session: 40 min  Stress: No Stress Concern Present (08/19/2022)   [employer] of Occupational Health - Occupational Stress Questionnaire    Feeling of Stress : Not at all  Social Connections: Socially Integrated (08/19/2022)   Social Connection and Isolation Panel [  NHANES]    Frequency of Communication with Friends and Family: More than three times a week    Frequency of Social Gatherings with Friends and Family: More than three times a week    Attends Religious Services: 1 to 4 times per year    Active Member of [employer] or Organizations: Yes    Attends Engineer, structural: More than 4 times per year    Marital Status: Married  Catering manager Violence: Not on file    Past Surgical History:  Procedure Laterality Date   CESAREAN SECTION     x2   COLONOSCOPY  06/04/2009   MEDIAL PARTIAL KNEE REPLACEMENT  2017   UPPER ESOPHAGEAL ENDOSCOPIC ULTRASOUND (EUS)  01/15/2010   Dr.Jacobs    Family History  Problem Relation Age of Onset   Uterine  cancer Mother    Alcoholism Mother    Lymphoma Father    Heart attack Father    Aneurysm Sister    Colon cancer Neg Hx    Colon polyps Neg Hx    Esophageal cancer Neg Hx    Rectal cancer Neg Hx    Stomach cancer Neg Hx     No Known Allergies  Current Outpatient Medications on File Prior to Visit  Medication Sig Dispense Refill   atorvastatin (LIPITOR) 10 MG tablet Take 1 tablet (10 mg total) by mouth daily. 90 tablet 3   levothyroxine (EUTHYROX) 75 MCG tablet Take 1 tablet (75 mcg total) by mouth daily before breakfast. 90 tablet 3   VITAMIN D PO Take by mouth.     No current facility-administered medications on file prior to visit.    BP 134/76   Pulse 75   Temp (!) 97.3 F (36.3 C) (Temporal)   Ht 5\' 4"  (1.626 m)   Wt 220 lb (99.8 kg)   SpO2 98%   BMI 37.76 kg/m  Objective:   Physical Exam Cardiovascular:     Rate and Rhythm: Normal rate.  Pulmonary:     Effort: Pulmonary effort is normal.     Breath sounds: Normal breath sounds.  Abdominal:     Tenderness: There is no right CVA tenderness or left CVA tenderness.  Musculoskeletal:     Cervical back: Neck supple.     Thoracic back: No swelling, tenderness or bony tenderness. Normal range of motion.       Back:     Comments: Pain with left thoracic twisting   Skin:    General: Skin is warm and dry.           Assessment & Plan:  Acute right-sided thoracic back pain Assessment & Plan: No alarm signs.   Differentials include MSK, arthritis, compression fracture (less likely). No evidence of shingles or renal stones.  Checking thoracic plain films today given osteopenia history and site of pain. Checking right unilateral rib films today.  Prescription for cyclobenzaprine 5 mg sent to pharmacy to take at bedtime as needed. Discussed stretching.  Consider physical therapy if no improvement.  Orders: -     DG Thoracic Spine W/Swimmers -     DG Ribs Unilateral Right -     Cyclobenzaprine HCl; Take 1  tablet (5 mg total) by mouth at bedtime as needed for muscle spasms.  Dispense: 14 tablet; Refill: 0        Doreene Nest, NP

## 2022-08-23 NOTE — Assessment & Plan Note (Addendum)
No alarm signs.   Differentials include MSK, arthritis, compression fracture (less likely). No evidence of shingles or renal stones.  Checking thoracic plain films today given osteopenia history and site of pain. Checking right unilateral rib films today.  Prescription for cyclobenzaprine 5 mg sent to pharmacy to take at bedtime as needed. Discussed stretching.  Consider physical therapy if no improvement.

## 2022-08-23 NOTE — Patient Instructions (Addendum)
Complete xray(s) prior to leaving today. I will notify you of your results once received.  You can take cyclobenzaprine 5 mg tablets every evening at bedtime for muscle spasms.  This may cause drowsiness.  Try stretching when able.   It was a pleasure meeting you!

## 2022-11-05 IMAGING — MG MM DIGITAL SCREENING BILAT W/ TOMO AND CAD
6 of 10 series · 6 of 30 positions shown · non-contrast
Comparison: Previous exam(s).

CLINICAL DATA: Screening.

EXAM:
DIGITAL SCREENING BILATERAL MAMMOGRAM WITH TOMOSYNTHESIS AND CAD
TECHNIQUE: Bilateral screening digital craniocaudal and mediolateral oblique
mammograms were obtained. Bilateral screening digital breast
tomosynthesis was performed. The images were evaluated with
computer-aided detection.

[R CC synth-2D (1 of 2)]
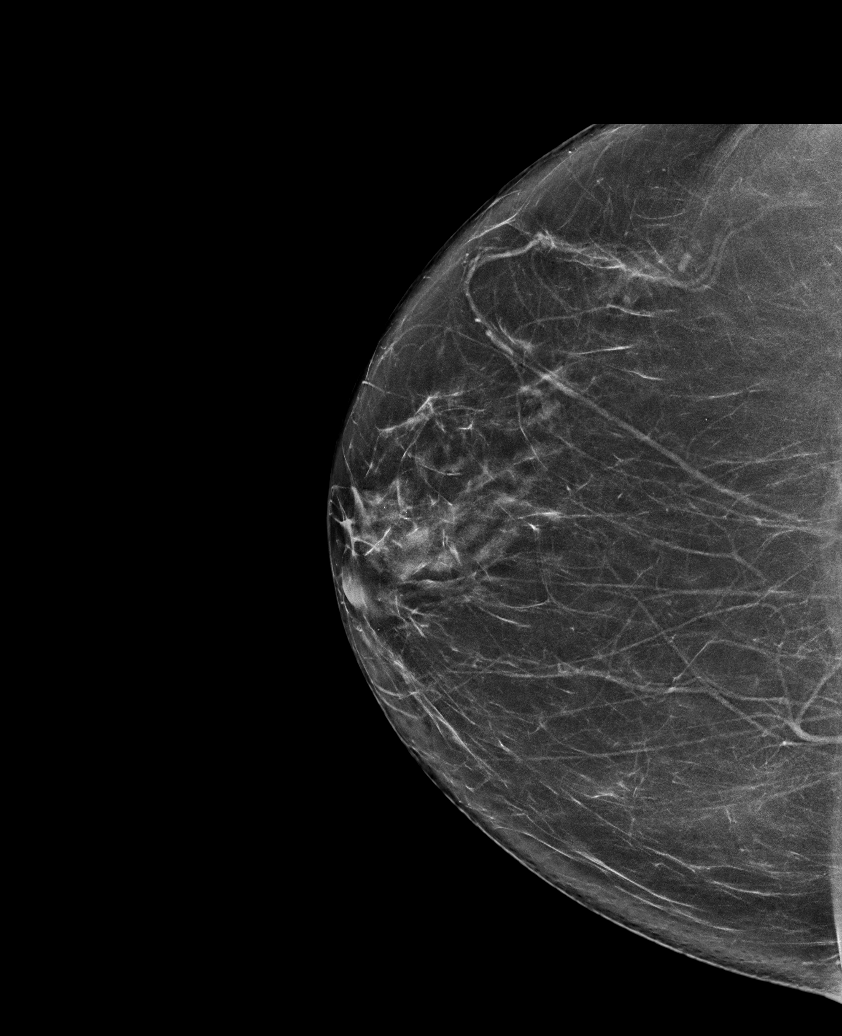

[L CC synth-2D]
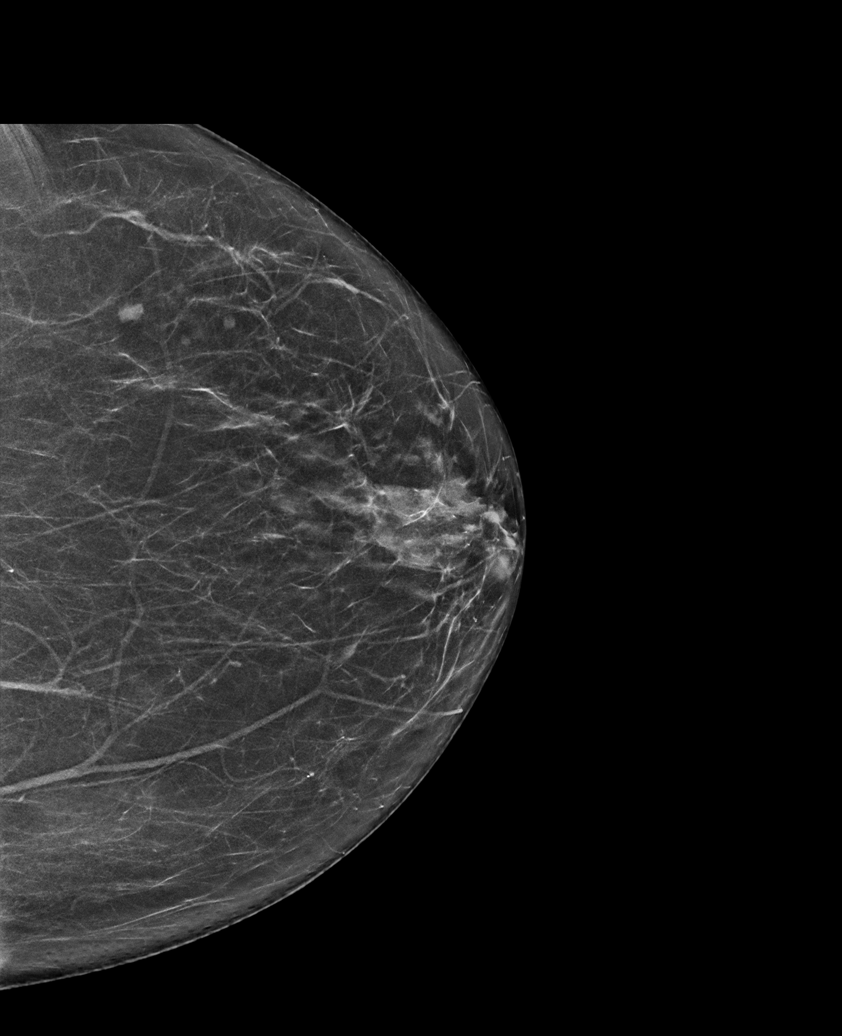

[R CC synth-2D (2 of 2)]
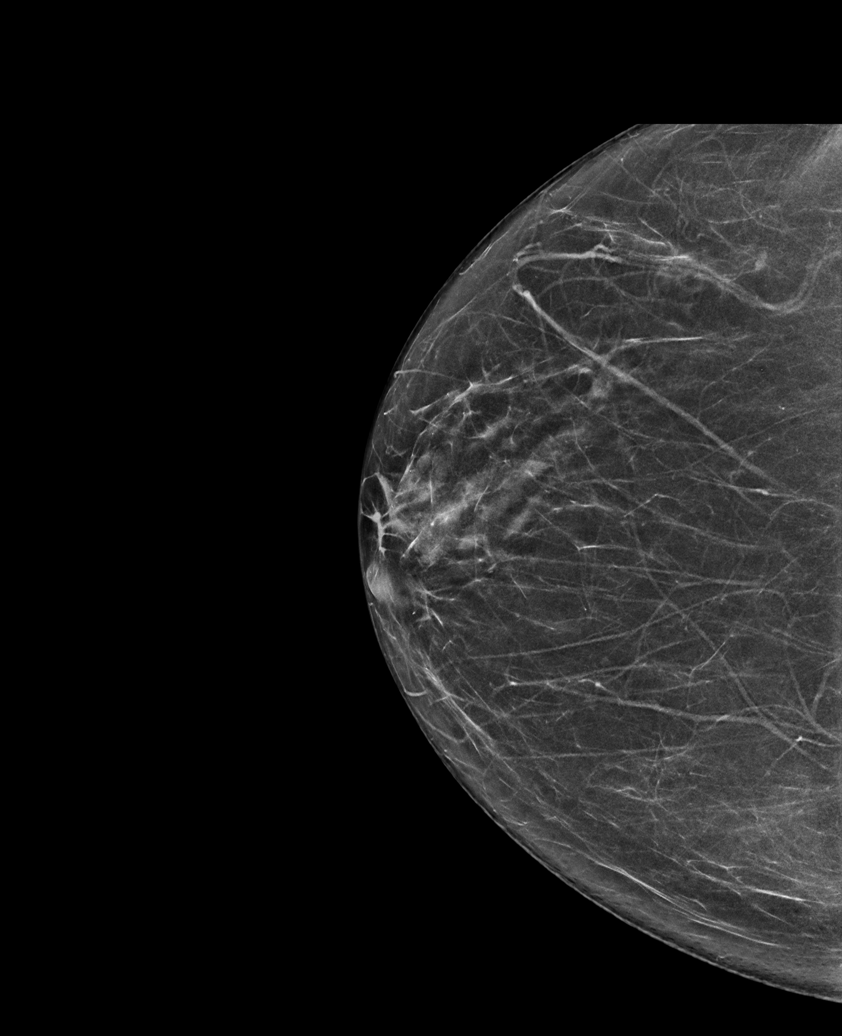

[R MLO synth-2D]
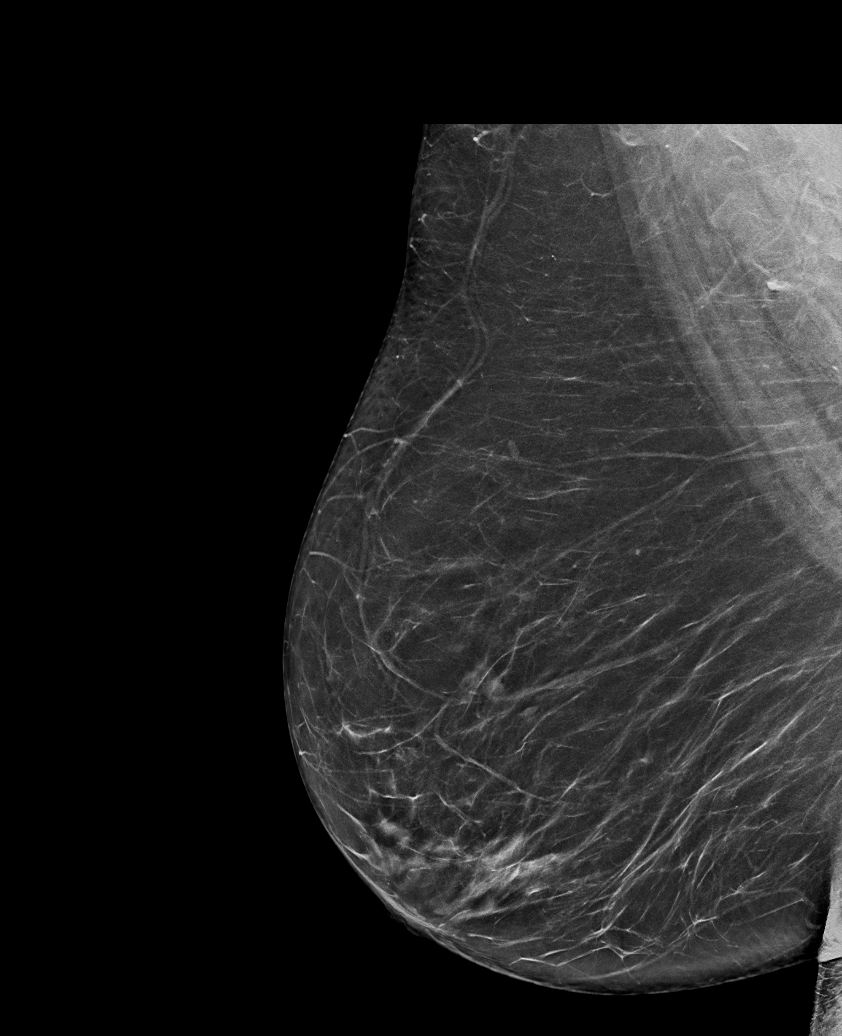

[L MLO synth-2D]
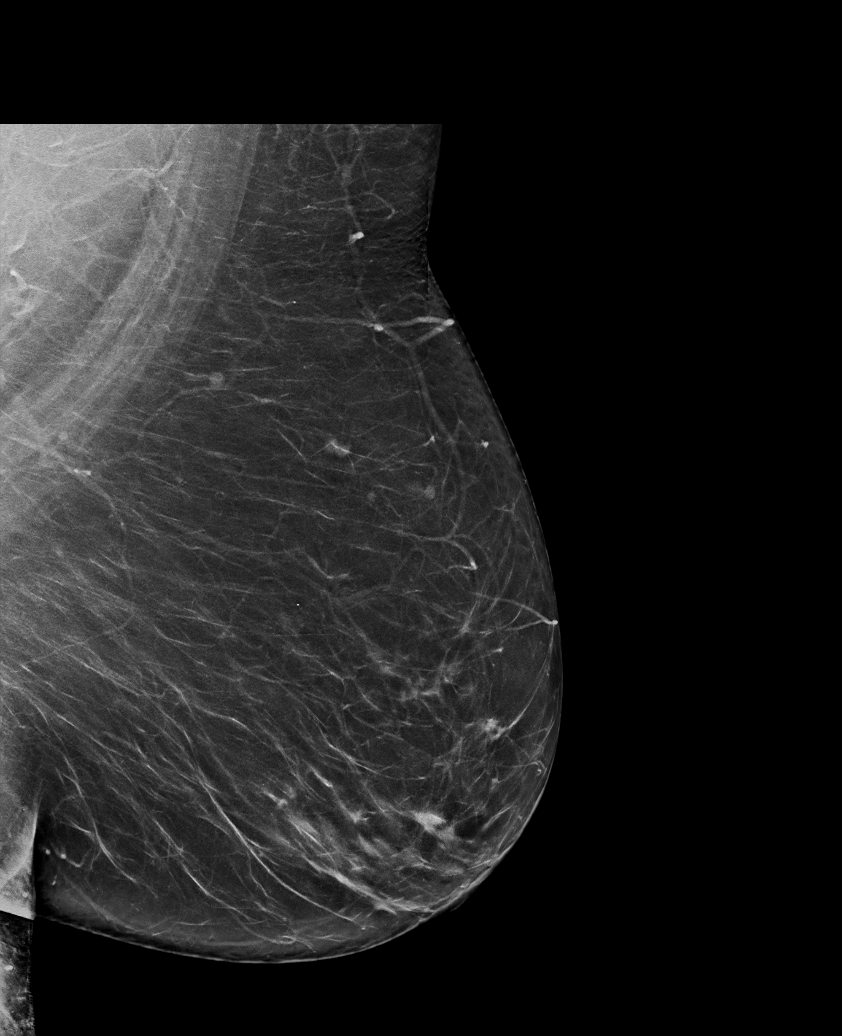

[L MLO tomo · tomo slice 39/78.0]
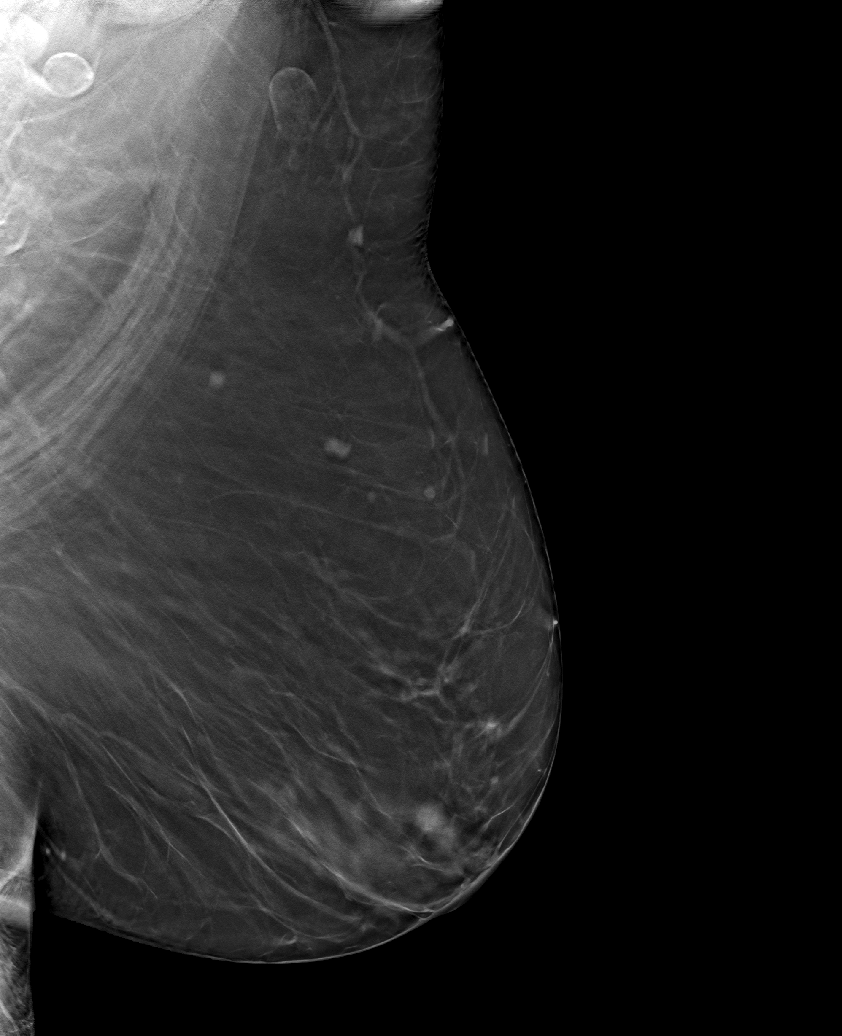

[6 of 30 positions shown; findings below may reference images not displayed]

ACR Breast Density Category b: There are scattered areas of
fibroglandular density.
FINDINGS: There are no findings suspicious for malignancy.
IMPRESSION: No mammographic evidence of malignancy. A result letter of this
screening mammogram will be mailed directly to the patient.

RECOMMENDATION:
Screening mammogram in one year. (Code:51-O-LD2)

BI-RADS CATEGORY  1: Negative.

## 2023-03-22 ENCOUNTER — Encounter: Payer: Self-pay | Admitting: Family Medicine

## 2023-03-22 ENCOUNTER — Ambulatory Visit (INDEPENDENT_AMBULATORY_CARE_PROVIDER_SITE_OTHER)
Admission: RE | Admit: 2023-03-22 | Discharge: 2023-03-22 | Disposition: A | Discharge status: Home / Self Care | Payer: No Typology Code available for payment source | Source: Ambulatory Visit | Attending: Family Medicine | Admitting: Family Medicine

## 2023-03-22 ENCOUNTER — Ambulatory Visit (INDEPENDENT_AMBULATORY_CARE_PROVIDER_SITE_OTHER): Payer: No Typology Code available for payment source | Admitting: Family Medicine

## 2023-03-22 VITALS — BP 122/74 | HR 84 | Temp 98.3°F | Ht 64.0 in | Wt 223.2 lb

## 2023-03-22 DIAGNOSIS — M25562 Pain in left knee: Secondary | ICD-10-CM

## 2023-03-22 NOTE — Patient Instructions (Signed)
 Xray today  We will contact you with result   Continue cold compress  Also elevate leg when you can (10 minutes at a time)   Try voltaren gel over the counter up to four times daily  Tylenol is ok   Try a compression knee sleeve when active and see if this helps

## 2023-03-22 NOTE — Assessment & Plan Note (Signed)
 3-4 weeks / dull and both lateral and medial with slight swelling and limited flexion  No reported trauma / but prone to arthritis  Exam confirms limited rom  Xray notes mod medial and mild to mod patellofemoral oa with joint effusion   Did recommend ice/elevation /compression  Will plan referral to orthopedics for further eval /tx

## 2023-03-22 NOTE — Progress Notes (Signed)
 Subjective:    Patient ID: Nicole [name], female    DOB: [birth year], 68 y.o.   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   Pain is latera/ medial both  Feels like she did something in the front when she tries to bend it  Occational posterior  More dull than sharp  A little swollen and tight   Worse to flex past 90 deg , and to fully extend  Worse to stand  At times worse to lie down- had to keep knees form touching each other  Sitting still is better    No compression    Using ice  Elevates when able  ES tylenol occational     Past history of injury/meniscal tear in right knee , partial knee repl =that is doing great  Saw Dr Josefina   Imaging today  DG Knee 4 Views W/Patella Left Result Date: 03/22/2023 CLINICAL DATA:  Pain and swelling of left knee for 3-4 weeks. EXAM: LEFT KNEE - COMPLETE 4+ VIEW COMPARISON:  None Available. FINDINGS: Left knee: Moderate medial compartment joint space narrowing with mild peripheral osteophytosis. Moderate joint effusion. Mild-to-moderate lateral patellofemoral joint space narrowing with mild peripheral lateral and minimal superior patellar degenerative spurring. Mild peripheral medial trochlear degenerative spurring. IMPRESSION: 1. Moderate medial compartment and mild-to-moderate patellofemoral compartment osteoarthritis. 2. Moderate joint effusion. Electronically Signed   By: Tanda Lyons M.D.   On: 03/22/2023 09:45     Patient Active Problem List   Diagnosis Date Noted   Left knee pain  03/22/2023   Acute thoracic back pain 08/23/2022   Prediabetes 05/17/2022   Osteopenia 06/30/2021   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   Estrogen deficiency 05/19/2020   Colon cancer screening 04/25/2019   Obesity 01/06/2015   Hypothyroid 09/17/2013   Encounter for routine gynecological examination 03/28/2012   Routine general medical examination at a health care facility 03/19/2012   CYST AND PSEUDOCYST OF PANCREAS 12/31/2009   NONSPECIFIC ABN FINDING RAD & OTH EXAM GI TRACT 04/07/2009   Hyperlipidemia LDL goal <130 08/02/2007   ECZEMA, HANDS 08/02/2007   Past Medical History:  Diagnosis Date   Hx of adenomatous colonic polyps 07/2019   Hyperlipidemia    Peripencreatic cyst 2011   aspirated by EUS   Thyroid  disease    Past Surgical History:  Procedure Laterality Date   CESAREAN SECTION     x2   COLONOSCOPY  06/04/2009   MEDIAL PARTIAL KNEE REPLACEMENT  2017   UPPER ESOPHAGEAL ENDOSCOPIC ULTRASOUND (EUS)  01/15/2010   Dr.Jacobs   Social History   Tobacco Use   Smoking status: Never   Smokeless tobacco: Never  Vaping Use   Vaping status: Never Used  Substance Use Topics   Alcohol use: Yes    Alcohol/week: 2.0 standard drinks of alcohol    Types: 2 Glasses of wine per week    Comment: 1-2 glasses of wine a week  Drug use: No   Family History  Problem Relation Age of Onset   Uterine cancer Mother    Alcoholism Mother    Lymphoma Father    Heart attack Father    Aneurysm Sister    Colon cancer Neg Hx    Colon polyps Neg Hx    Esophageal cancer Neg Hx    Rectal cancer Neg Hx    Stomach cancer Neg Hx    No Known Allergies Current Outpatient Medications on File Prior to Visit  Medication Sig Dispense Refill   atorvastatin  (LIPITOR) 10 MG tablet Take 1 tablet (10 mg total) by mouth daily. 90 tablet 3   levothyroxine  (EUTHYROX ) 75 MCG tablet Take 1 tablet (75 mcg total) by mouth daily before breakfast. 90 tablet 3   VITAMIN D PO Take by mouth.     No current  facility-administered medications on file prior to visit.    Review of Systems  Constitutional:  Negative for activity change, appetite change, fatigue, fever and unexpected weight change.  HENT:  Negative for congestion, ear pain, rhinorrhea, sinus pressure and sore throat.   Eyes:  Negative for pain, redness and visual disturbance.  Respiratory:  Negative for cough, shortness of breath and wheezing.   Cardiovascular:  Negative for chest pain and palpitations.  Gastrointestinal:  Negative for abdominal pain, blood in stool, constipation and diarrhea.  Endocrine: Negative for polydipsia and polyuria.  Genitourinary:  Negative for dysuria, frequency and urgency.  Musculoskeletal:  Positive for arthralgias. Negative for back pain and myalgias.  Skin:  Negative for pallor and rash.  Allergic/Immunologic: Negative for environmental allergies.  Neurological:  Negative for dizziness, syncope and headaches.  Hematological:  Negative for adenopathy. Does not bruise/bleed easily.  Psychiatric/Behavioral:  Negative for decreased concentration and dysphoric mood. The patient is not nervous/anxious.        Objective:   Physical Exam Constitutional:      General: She is not in acute distress.    Appearance: Normal appearance. She is obese. She is not ill-appearing.  Cardiovascular:     Rate and Rhythm: Normal rate and regular rhythm.  Musculoskeletal:     Comments: Knee left  Mild swelling / possible small effusion  No warmth to the touch  Mild crepitus  ROM: limited Flex 90 deg with pain  Ext almost full with pain  [place]ray-mild discomfort  Bounce test - uncomfortable   Stability: Anterior drawer-nl Lachman exam -normal   Tenderness - over med and lat joint lines and patellar tendon   Gait favors right leg Can bear weight on left      Skin:    General: Skin is warm and dry.     Findings: No bruising, erythema or rash.  Neurological:     Mental Status: She is alert.      Sensory: No sensory deficit.     Motor: No weakness.  Psychiatric:        Mood and Affect: Mood normal.           Assessment & Plan:   Problem List Items Addressed This Visit       Other   Left knee pain - Primary   3-4 weeks / dull and both lateral and medial with slight swelling and limited flexion  No reported trauma / but prone to arthritis  Exam confirms limited rom  Xray notes mod medial and mild to mod patellofemoral oa with joint effusion   Did recommend ice/elevation /compression  Will plan referral to orthopedics  for further eval /tx       Relevant Orders   DG Knee 4 Views W/Patella Left (Completed)

## 2023-04-08 ENCOUNTER — Encounter (HOSPITAL_COMMUNITY): Payer: Self-pay

## 2023-04-08 ENCOUNTER — Ambulatory Visit (HOSPITAL_COMMUNITY)
Admission: RE | Admit: 2023-04-08 | Discharge: 2023-04-08 | Disposition: A | Discharge status: Home / Self Care | Payer: No Typology Code available for payment source | Source: Ambulatory Visit | Attending: Family Medicine | Admitting: Family Medicine

## 2023-04-08 VITALS — BP 151/87 | HR 66 | Temp 97.8°F | Resp 16

## 2023-04-08 DIAGNOSIS — M25462 Effusion, left knee: Secondary | ICD-10-CM

## 2023-04-08 DIAGNOSIS — M25562 Pain in left knee: Secondary | ICD-10-CM | POA: Diagnosis not present

## 2023-04-08 NOTE — ED Triage Notes (Signed)
Patient presenting with pain in the left knee and thigh onset 1 month ago. Patient was seen by Dr tower for this and everything came back normal. States the pain is not improving. States there are some bruises on the leg. No known falls or injuries.  Prescriptions or OTC medications tried: Yes- Aleve     with mild relief of pain.   "Persistent pain in knee and upper left leg.  Have been to primary care for this recently.  They took xrays and found no issues.   Pain is making it difficult to walk" - Entered by patient

## 2023-04-08 NOTE — Discharge Instructions (Addendum)
As discussed during your appointment your left knee has a joint effusion.  This means that there is fluid in the joint compartment of your knee.  This is likely what is causing your discomfort and ongoing pain.  Please make sure that you keep your appointment with orthopedics so this can be drained appropriately.  While you are waiting to be seen by orthopedics you can use warm compresses to the area, ibuprofen and Tylenol as needed for pain management.  It was nice to meet you and I appreciate the opportunity to be involved in your care If you were satisfied with the care you received from me, I would greatly appreciate you saying so in the after-visit survey that is sent out following our visit.

## 2023-04-08 NOTE — ED Provider Notes (Signed)
[facility]    CSN: [id number] Arrival date & time: 04/08/23  1409      History   Chief Complaint Chief Complaint  Patient presents with   Leg Pain   Appointment    HPI Nicole [name] is a 68 y.o. female.   HPI  She reports she is having left leg bruising along her shin and her knee  She states she also has posterior knee pain  She reports she went to see her PCP on 03/22/23 - had imaging performed which shows the following   FINDINGS: Left knee:   Moderate medial compartment joint space narrowing with mild peripheral osteophytosis. Moderate joint effusion. Mild-to-moderate lateral patellofemoral joint space narrowing with mild peripheral lateral and minimal superior patellar degenerative spurring. Mild peripheral medial trochlear degenerative spurring.   IMPRESSION: 1. Moderate medial compartment and mild-to-moderate patellofemoral compartment osteoarthritis. 2. Moderate joint effusion.  Reviewed these results with the patient and discussed that best course would be arthrocentesis  and potential steroid injection  She made apt with ortho during visit here in [facility] for this to be completed    Past Medical History:  Diagnosis Date   Hx of adenomatous colonic polyps 07/2019   Hyperlipidemia    Peripencreatic cyst 2011   aspirated by EUS   Thyroid disease     Patient Active Problem List   Diagnosis Date Noted   Left knee pain 03/22/2023   Acute thoracic back pain 08/23/2022   Prediabetes 05/17/2022   Osteopenia 06/30/2021   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   Estrogen deficiency 05/19/2020   Colon cancer screening 04/25/2019   Obesity 01/06/2015   Hypothyroid 09/17/2013   Encounter for routine gynecological examination 03/28/2012   Routine general medical examination at a health care facility 03/19/2012   CYST AND PSEUDOCYST OF PANCREAS 12/31/2009   NONSPECIFIC ABN FINDING RAD & OTH EXAM GI TRACT 04/07/2009   Hyperlipidemia LDL goal <130  08/02/2007   ECZEMA, HANDS 08/02/2007    Past Surgical History:  Procedure Laterality Date   CESAREAN SECTION     x2   COLONOSCOPY  06/04/2009   MEDIAL PARTIAL KNEE REPLACEMENT  2017   UPPER ESOPHAGEAL ENDOSCOPIC ULTRASOUND (EUS)  01/15/2010   Dr.Jacobs    OB History   No obstetric history on file.      Home Medications    Prior to Admission medications   Medication Sig Start Date End Date Taking? Authorizing Provider  atorvastatin (LIPITOR) 10 MG tablet Take 1 tablet (10 mg total) by mouth daily. 05/25/22  Yes Tower, Audrie Gallus, MD  levothyroxine (EUTHYROX) 75 MCG tablet Take 1 tablet (75 mcg total) by mouth daily before breakfast. 05/25/22  Yes Tower, Audrie Gallus, MD  VITAMIN D PO Take by mouth.   Yes [provider]    Family History Family History  Problem Relation Age of Onset   Uterine cancer Mother    Alcoholism Mother    Lymphoma Father    Heart attack Father    Aneurysm Sister    Colon cancer Neg Hx    Colon polyps Neg Hx    Esophageal cancer Neg Hx    Rectal cancer Neg Hx    Stomach cancer Neg Hx     Social History Social History   Tobacco Use   Smoking status: Never   Smokeless tobacco: Never  Vaping Use   Vaping status: Never Used  Substance Use Topics   Alcohol use: Yes    Alcohol/week: 2.0 standard drinks of alcohol  Types: 2 Glasses of wine per week    Comment: 1-2 glasses of wine a week   Drug use: No     Allergies   Patient has no known allergies.   Review of Systems Review of Systems  Constitutional:  Negative for chills and fever.  Musculoskeletal:  Positive for arthralgias and joint swelling.     Physical Exam Triage Vital Signs ED Triage Vitals  Encounter Vitals Group     BP 04/08/23 1441 (!) 151/87     Systolic BP Percentile --      Diastolic BP Percentile --      Pulse Rate 04/08/23 1441 66     Resp 04/08/23 1441 16     Temp 04/08/23 1441 97.8 F (36.6 C)     Temp Source 04/08/23 1441 Oral     SpO2 04/08/23  1441 96 %     Weight --      Height --      Head Circumference --      Peak Flow --      Pain Score 04/08/23 1440 8     Pain Loc --      Pain Education --      Exclude from Growth Chart --    No data found.  Updated Vital Signs BP (!) 151/87 (BP Location: Left Arm)   Pulse 66   Temp 97.8 F (36.6 C) (Oral)   Resp 16   SpO2 96%   Visual Acuity Right Eye Distance:   Left Eye Distance:   Bilateral Distance:    Right Eye Near:   Left Eye Near:    Bilateral Near:     Physical Exam Vitals reviewed.  Constitutional:      Appearance: Normal appearance.  HENT:     Head: Normocephalic and atraumatic.  Musculoskeletal:     Left knee: Swelling and effusion present. No erythema or ecchymosis. Normal range of motion. Tenderness present over the medial joint line.  Neurological:     Mental Status: She is alert.  Psychiatric:        Mood and Affect: Mood normal.        Behavior: Behavior normal.        Thought Content: Thought content normal.        Judgment: Judgment normal.      UC Treatments / Results  Labs (all labs ordered are listed, but only abnormal results are displayed) Labs Reviewed - No data to display  EKG   Radiology No results found.  Procedures Procedures (including critical care time)  Medications Ordered in UC Medications - No data to display  Initial Impression / Assessment and Plan / UC Course  I have reviewed the triage vital signs and the nursing notes.  Pertinent labs & imaging results that were available during my care of the patient were reviewed by me and considered in my medical decision making (see chart for details).      Final Clinical Impressions(s) / UC Diagnoses   Final diagnoses:  Acute pain of left knee  Effusion of left knee   Acute, new concern Patient reports that she has had left-sided knee pain and swelling since the beginning of February.  She did see her PCP for this who performed x-rays.  Imaging results were  reviewed with her during her visit to urgent care today.  We reviewed that a joint effusion means that there is excess fluid in the joint compartment which is likely causing excess pressure and pain.  Reviewed that the best course of action would be to have this drained via arthrocentesis and potentially have a steroid injected into the joint to further assist with inflammation and pain management.  We reviewed that this could be done here or with her orthopedic provider per her preference.  During the appointment at urgent care she called her orthopedic specialist and scheduled an appointment to have the procedure performed there for next week.  At this time recommend conservative measures while waiting for appointment.  Recommend using warm compresses, ibuprofen and Tylenol as needed for pain management. Follow up as needed with Ortho and PCP for ongoing management.      Discharge Instructions      As discussed during your appointment your left knee has a joint effusion.  This means that there is fluid in the joint compartment of your knee.  This is likely what is causing your discomfort and ongoing pain.  Please make sure that you keep your appointment with orthopedics so this can be drained appropriately.  While you are waiting to be seen by orthopedics you can use warm compresses to the area, ibuprofen and Tylenol as needed for pain management.  It was nice to meet you and I appreciate the opportunity to be involved in your care If you were satisfied with the care you received from me, I would greatly appreciate you saying so in the after-visit survey that is sent out following our visit.       ED Prescriptions   None    PDMP not reviewed this encounter.   Providence Crosby, PA-C 04/08/23 1606

## 2023-05-10 ENCOUNTER — Other Ambulatory Visit: Payer: Self-pay | Admitting: Family Medicine

## 2023-05-23 ENCOUNTER — Telehealth: Payer: Self-pay | Admitting: Family Medicine

## 2023-05-23 DIAGNOSIS — R7303 Prediabetes: Secondary | ICD-10-CM

## 2023-05-23 DIAGNOSIS — E785 Hyperlipidemia, unspecified: Secondary | ICD-10-CM

## 2023-05-23 DIAGNOSIS — E039 Hypothyroidism, unspecified: Secondary | ICD-10-CM

## 2023-05-23 NOTE — Telephone Encounter (Signed)
-----   Message from Alvina Chou sent at 05/06/2023 10:01 AM EDT ----- Regarding: Lab orders for Tue, 4.8.25 Patient is scheduled for CPX labs, please order future labs, Thanks , Camelia Eng

## 2023-05-24 ENCOUNTER — Telehealth: Payer: Self-pay | Admitting: *Deleted

## 2023-05-24 ENCOUNTER — Other Ambulatory Visit (INDEPENDENT_AMBULATORY_CARE_PROVIDER_SITE_OTHER): Payer: No Typology Code available for payment source

## 2023-05-24 DIAGNOSIS — R7303 Prediabetes: Secondary | ICD-10-CM

## 2023-05-24 DIAGNOSIS — E039 Hypothyroidism, unspecified: Secondary | ICD-10-CM | POA: Diagnosis not present

## 2023-05-24 DIAGNOSIS — E785 Hyperlipidemia, unspecified: Secondary | ICD-10-CM | POA: Diagnosis not present

## 2023-05-24 LAB — LIPID PANEL
Cholesterol: 158 mg/dL (ref 0–200)
HDL: 56.1 mg/dL (ref >39.00)
LDL Cholesterol: 87 mg/dL (ref 0–99)
NonHDL: 102.15
Total CHOL/HDL Ratio: 3
Triglycerides: 75 mg/dL (ref 0.0–149.0)
VLDL: 15 mg/dL (ref 0.0–40.0)

## 2023-05-24 LAB — HEMOGLOBIN A1C: Hgb A1c MFr Bld: 5.8 % (ref 4.6–6.5)

## 2023-05-24 LAB — COMPREHENSIVE METABOLIC PANEL WITH GFR
ALT: 27 U/L (ref 0–35)
AST: 25 U/L (ref 0–37)
Albumin: 4.5 g/dL (ref 3.5–5.2)
Alkaline Phosphatase: 97 U/L (ref 39–117)
BUN: 14 mg/dL (ref 6–23)
CO2: 27 meq/L (ref 19–32)
Calcium: 10.3 mg/dL (ref 8.4–10.5)
Chloride: 106 meq/L (ref 96–112)
Creatinine, Ser: 0.87 mg/dL (ref 0.40–1.20)
GFR: 68.47 mL/min (ref >60.00)
Glucose, Bld: 90 mg/dL (ref 70–99)
Potassium: 4.6 meq/L (ref 3.5–5.1)
Sodium: 141 meq/L (ref 135–145)
Total Bilirubin: 0.6 mg/dL (ref 0.2–1.2)
Total Protein: 7.1 g/dL (ref 6.0–8.3)

## 2023-05-24 LAB — TSH: TSH: 2.94 u[IU]/mL (ref 0.35–5.50)

## 2023-05-24 NOTE — Telephone Encounter (Signed)
 LVM for patient to c/b and schedule.

## 2023-05-24 NOTE — Telephone Encounter (Signed)
 We received surgical clearance forms from [facility], per Dr. Milinda Antis pt needs surgical clearance appt., please schedule

## 2023-05-25 NOTE — Telephone Encounter (Signed)
 We may or may not have time (also ? If insurance will bill her 2 visits)  Let's do the best we can at that visit and if we run out of time we can schedule a quick return visit

## 2023-05-25 NOTE — Telephone Encounter (Signed)
 Patient has her annual physical scheduled on 04/14, can she do the clearance then or do she need separate appointment?

## 2023-05-30 ENCOUNTER — Ambulatory Visit (INDEPENDENT_AMBULATORY_CARE_PROVIDER_SITE_OTHER): Payer: No Typology Code available for payment source | Admitting: Family Medicine

## 2023-05-30 ENCOUNTER — Encounter: Payer: Self-pay | Admitting: Family Medicine

## 2023-05-30 VITALS — BP 132/78 | HR 63 | Temp 97.8°F | Ht 63.5 in | Wt 220.2 lb

## 2023-05-30 DIAGNOSIS — E039 Hypothyroidism, unspecified: Secondary | ICD-10-CM | POA: Diagnosis not present

## 2023-05-30 DIAGNOSIS — Z0000 Encounter for general adult medical examination without abnormal findings: Secondary | ICD-10-CM | POA: Diagnosis not present

## 2023-05-30 DIAGNOSIS — E66812 Obesity, class 2: Secondary | ICD-10-CM

## 2023-05-30 DIAGNOSIS — R7303 Prediabetes: Secondary | ICD-10-CM | POA: Diagnosis not present

## 2023-05-30 DIAGNOSIS — E6609 Other obesity due to excess calories: Secondary | ICD-10-CM

## 2023-05-30 DIAGNOSIS — Z6838 Body mass index (BMI) 38.0-38.9, adult: Secondary | ICD-10-CM

## 2023-05-30 DIAGNOSIS — E785 Hyperlipidemia, unspecified: Secondary | ICD-10-CM

## 2023-05-30 DIAGNOSIS — Z1211 Encounter for screening for malignant neoplasm of colon: Secondary | ICD-10-CM

## 2023-05-30 DIAGNOSIS — M8588 Other specified disorders of bone density and structure, other site: Secondary | ICD-10-CM

## 2023-05-30 MED ORDER — LEVOTHYROXINE SODIUM 75 MCG PO TABS: 75.0000 ug | ORAL_TABLET | Freq: Every day | ORAL | 3 refills | Status: AC

## 2023-05-30 NOTE — Assessment & Plan Note (Signed)
 Hypothyroidism  Pt has no clinical changes No change in energy level/ hair or skin/ edema and no tremor Lab Results  Component Value Date   TSH 2.94 05/24/2023    Plan to continue levothyroxine 75 mcg daily

## 2023-05-30 NOTE — Assessment & Plan Note (Signed)
 Discussed how this problem influences overall health and the risks it imposes  Reviewed plan for weight loss with lower calorie diet (via better food choices (lower glycemic and portion control) along with exercise building up to or more than 30 minutes 5 days per week including some aerobic activity and strength training

## 2023-05-30 NOTE — Assessment & Plan Note (Signed)
 Reviewed health habits including diet and exercise and skin cancer prevention Reviewed appropriate screening tests for age  Also reviewed health mt list, fam hx and immunization status , as well as social and family history   See HPI Labs reviewed and ordered Health Maintenance  Topic Date Due   COVID-19 Vaccine (6 - 2024-25 season) 04/06/2024*   Mammogram  08/05/2023   Flu Shot  09/16/2023   Pneumonia Vaccine (3 of 3 - PCV20 or PCV21) 05/21/2026   Colon Cancer Screening  07/17/2026   DTaP/Tdap/Td vaccine (4 - Tdap) 05/21/2031   DEXA scan (bone density measurement)  Completed   Hepatitis C Screening  Completed   Zoster (Shingles) Vaccine  Completed   HPV Vaccine  Aged Out   Meningitis B Vaccine  Aged Out  *Topic was postponed. The date shown is not the original due date.   Discussed fall prevention, supplements and exercise for bone density  Pt is not ready to schedule dexa yet- will call for order once she starts using medicare  Encouraged more strength building exercise  PHQ 0

## 2023-05-30 NOTE — Assessment & Plan Note (Signed)
 Dexa is due in may but pt wants to wait to get until she starts using medicare (June or later) Will call for order when ready to schedule  Discussed fall prevention, supplements and exercise for bone density  No falls or fractures

## 2023-05-30 NOTE — Assessment & Plan Note (Signed)
 Colonoscopy 07/2019 with 8 year recall

## 2023-05-30 NOTE — Progress Notes (Signed)
 Subjective:    Patient ID: Nicole [name], female    DOB: 1955-10-05, 68 y.o.   MRN: [id number]  HPI  Here for health maintenance exam and to review chronic medical problems   Wt Readings from Last 3 Encounters:  05/30/23 220 lb 4 oz (99.9 kg)  03/22/23 223 lb 4 oz (101.3 kg)  08/23/22 220 lb (99.8 kg)   38.40 kg/m  Vitals:   05/30/23 1547  BP: 132/78  Pulse: 63  Temp: 97.8 F (36.6 C)  SpO2: 98%    Immunization History  Administered Date(s) Administered   Influenza Inj Mdck Quad Pf 12/11/2017   Influenza Whole 11/15/2005   Influenza, High Dose Seasonal PF 11/23/2022   Influenza, Seasonal, Injecte, Preservative Fre 12/06/2015   Influenza-Unspecified 11/15/2012, 11/16/2014, 10/18/2018, 10/27/2019, 11/27/2020   PFIZER(Purple Top)SARS-COV-2 Vaccination 04/19/2019, 05/17/2019, 11/27/2019   Pfizer Covid-19 Vaccine Bivalent Booster 23yrs & up 11/27/2020   Pfizer(Comirnaty)Fall Seasonal Vaccine 12 years and older 11/23/2022   Pneumococcal Conjugate-13 05/20/2021   Pneumococcal Polysaccharide-23 04/30/2015   Td 12/23/1997, 04/15/2009, 05/20/2021   Zoster Recombinant(Shingrix) 07/06/2019, 09/05/2019   Zoster, Live 04/02/2015    There are no preventive care reminders to display for this patient.  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  A lot of work on her property  Heavy lifting with that in season  Swimming     Planning partial knee replacement  Takes meloxicam as needed    Mood    05/30/2023    3:51 PM 05/25/2022    2:57 PM 05/19/2020    8:43 AM  05/19/2020    7:59 AM 04/25/2019    8:52 AM  Depression screen PHQ 2/9  Decreased Interest 0 0 0 0 0  Down, Depressed, Hopeless 0 0 0 0 0  PHQ - 2 Score 0 0 0 0 0  Altered sleeping 0 0 0  0  Tired, decreased energy 0 0 0  0  Change in appetite 0 0 0  0  Feeling bad or failure about yourself  0 0 0  0  Trouble concentrating 0 0 0  0  Moving slowly or fidgety/restless 0 0 0  0  Suicidal thoughts 0 0 0  0  PHQ-9 Score 0 0 0  0  Difficult doing work/chores Not difficult at all Not difficult at all Not difficult at all  Not difficult at all    Hypothyroidism  Pt has no clinical changes No change in energy level/ hair or skin/ edema and no tremor Lab Results  Component Value Date   TSH 2.94 05/24/2023    Levothyroxine 75 mcg daily    Hyperlipidemia Lab Results  Component Value Date   CHOL 158 05/24/2023   CHOL 141 05/18/2022   CHOL 164 05/13/2021   Lab Results  Component Value Date   HDL 56.10 05/24/2023   HDL 40.00 05/18/2022   HDL 53.60 05/13/2021   Lab Results  Component Value Date   LDLCALC 87 05/24/2023   LDLCALC 84 05/18/2022  LDLCALC 96 05/13/2021   Lab Results  Component Value Date   TRIG 75.0 05/24/2023   TRIG 84.0 05/18/2022   TRIG 74.0 05/13/2021   Lab Results  Component Value Date   CHOLHDL 3 05/24/2023   CHOLHDL 4 05/18/2022   CHOLHDL 3 05/13/2021   Lab Results  Component Value Date   LDLDIRECT 157.8 03/23/2012   LDLDIRECT 154.9 11/27/2009   LDLDIRECT 178.7 08/14/2009   Well controlled  Atorvastatin 10 mg daily  Diet   Prediabetes Lab Results  Component Value Date   HGBA1C 5.8 05/24/2023   HGBA1C 5.9 05/18/2022   Lab Results  Component Value Date   NA 141 05/24/2023   K 4.6 05/24/2023   CO2 27 05/24/2023   GLUCOSE 90 05/24/2023   BUN 14 05/24/2023   CREATININE 0.87 05/24/2023   CALCIUM 10.3 05/24/2023   GFR 68.47 05/24/2023   GFRNONAA 92.58 04/15/2009   Lab Results  Component Value Date   ALT 27 05/24/2023   AST 25  05/24/2023   ALKPHOS 97 05/24/2023   BILITOT 0.6 05/24/2023       Patient Active Problem List   Diagnosis Date Noted   Left knee pain 03/22/2023   Acute thoracic back pain 08/23/2022   Prediabetes 05/17/2022   Osteopenia 06/30/2021   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   Estrogen deficiency 05/19/2020   Colon cancer screening 04/25/2019   Obesity 01/06/2015   Hypothyroid 09/17/2013   Encounter for routine gynecological examination 03/28/2012   Routine general medical examination at a health care facility 03/19/2012   CYST AND PSEUDOCYST OF PANCREAS 12/31/2009   NONSPECIFIC ABN FINDING RAD & OTH EXAM GI TRACT 04/07/2009   Hyperlipidemia LDL goal <130 08/02/2007   ECZEMA, HANDS 08/02/2007   Past Medical History:  Diagnosis Date   Arthritis    related to partial knee replacements   Hx of adenomatous colonic polyps 07/2019   Hyperlipidemia    Peripencreatic cyst 2011   aspirated by EUS   Thyroid disease    Past Surgical History:  Procedure Laterality Date   CESAREAN SECTION     x2   COLONOSCOPY  06/04/2009   JOINT REPLACEMENT  2017   partial right knee   MEDIAL PARTIAL KNEE REPLACEMENT  2017   TUBAL LIGATION     1993   UPPER ESOPHAGEAL ENDOSCOPIC ULTRASOUND (EUS)  01/15/2010   Dr.Jacobs   Social History   Tobacco Use   Smoking status: Never   Smokeless tobacco: Never  Vaping Use   Vaping status: Never Used  Substance Use Topics   Alcohol use: Yes    Alcohol/week: 2.0 standard drinks of alcohol    Types: 2 Glasses of wine per week    Comment: 1 - 2 drinks per week   Drug use: Never   Family History  Problem Relation Age of Onset   Uterine cancer Mother    Alcoholism Mother    Lymphoma Father    Heart attack Father    Aneurysm Sister    Colon cancer Neg Hx    Colon polyps Neg Hx    Esophageal cancer Neg Hx    Rectal cancer Neg Hx    Stomach cancer Neg Hx    No Known Allergies Current Outpatient Medications on File Prior to Visit  Medication Sig  Dispense Refill   atorvastatin (LIPITOR) 10 MG tablet Take 1 tablet by mouth once daily 90 tablet 0   meloxicam (MOBIC) 15 MG tablet Take 15 mg by mouth daily.  VITAMIN D PO Take by mouth.     No current facility-administered medications on file prior to visit.    Review of Systems  Constitutional:  Negative for activity change, appetite change, fatigue, fever and unexpected weight change.  HENT:  Negative for congestion, ear pain, rhinorrhea, sinus pressure and sore throat.   Eyes:  Negative for pain, redness and visual disturbance.  Respiratory:  Negative for cough, shortness of breath and wheezing.   Cardiovascular:  Negative for chest pain and palpitations.  Gastrointestinal:  Negative for abdominal pain, blood in stool, constipation and diarrhea.  Endocrine: Negative for polydipsia and polyuria.  Genitourinary:  Negative for dysuria, frequency and urgency.  Musculoskeletal:  Positive for arthralgias. Negative for back pain and myalgias.  Skin:  Negative for pallor and rash.  Allergic/Immunologic: Negative for environmental allergies.  Neurological:  Negative for dizziness, syncope and headaches.  Hematological:  Negative for adenopathy. Does not bruise/bleed easily.  Psychiatric/Behavioral:  Negative for decreased concentration and dysphoric mood. The patient is not nervous/anxious.        Objective:   Physical Exam Constitutional:      General: She is not in acute distress.    Appearance: Normal appearance. She is well-developed. She is obese. She is not ill-appearing or diaphoretic.  HENT:     Head: Normocephalic and atraumatic.     Right Ear: Tympanic membrane, ear canal and external ear normal.     Left Ear: Tympanic membrane, ear canal and external ear normal.     Nose: Nose normal. No congestion.     Mouth/Throat:     Mouth: Mucous membranes are moist.     Pharynx: Oropharynx is clear. No posterior oropharyngeal erythema.  Eyes:     General: No scleral icterus.     Extraocular Movements: Extraocular movements intact.     Conjunctiva/sclera: Conjunctivae normal.     Pupils: Pupils are equal, round, and reactive to light.  Neck:     Thyroid: No thyromegaly.     Vascular: No carotid bruit or JVD.  Cardiovascular:     Rate and Rhythm: Normal rate and regular rhythm.     Pulses: Normal pulses.     Heart sounds: Normal heart sounds.     No gallop.  Pulmonary:     Effort: Pulmonary effort is normal. No respiratory distress.     Breath sounds: Normal breath sounds. No wheezing.     Comments: Good air exch Chest:     Chest wall: No tenderness.  Abdominal:     General: Bowel sounds are normal. There is no distension or abdominal bruit.     Palpations: Abdomen is soft. There is no mass.     Tenderness: There is no abdominal tenderness.     Hernia: No hernia is present.  Genitourinary:    Comments: Breast exam: No mass, nodules, thickening, tenderness, bulging, retraction, inflamation, nipple discharge or skin changes noted.  No axillary or clavicular LA.     Musculoskeletal:        General: No tenderness. Normal range of motion.     Cervical back: Normal range of motion and neck supple. No rigidity. No muscular tenderness.     Right lower leg: No edema.     Left lower leg: No edema.     Comments: No kyphosis   Lymphadenopathy:     Cervical: No cervical adenopathy.  Skin:    General: Skin is warm and dry.     Coloration: Skin is not pale.  Findings: No erythema or rash.     Comments: Solar lentigines diffusely    Neurological:     Mental Status: She is alert. Mental status is at baseline.     Cranial Nerves: No cranial nerve deficit.     Motor: No abnormal muscle tone.     Coordination: Coordination normal.     Gait: Gait normal.     Deep Tendon Reflexes: Reflexes are normal and symmetric. Reflexes normal.  Psychiatric:        Mood and Affect: Mood normal.        Cognition and Memory: Cognition and memory normal.            Assessment & Plan:   Problem List Items Addressed This Visit       Endocrine   Hypothyroid   Hypothyroidism  Pt has no clinical changes No change in energy level/ hair or skin/ edema and no tremor Lab Results  Component Value Date   TSH 2.94 05/24/2023    Plan to continue levothyroxine 75 mcg daily      Relevant Medications   levothyroxine (SYNTHROID) 75 MCG tablet     Musculoskeletal and Integument   Osteopenia   Dexa is due in may but pt wants to wait to get until she starts using medicare (June or later) Will call for order when ready to schedule  Discussed fall prevention, supplements and exercise for bone density  No falls or fractures         Other   Routine general medical examination at a health care facility - Primary   Reviewed health habits including diet and exercise and skin cancer prevention Reviewed appropriate screening tests for age  Also reviewed health mt list, fam hx and immunization status , as well as social and family history   See HPI Labs reviewed and ordered Health Maintenance  Topic Date Due   COVID-19 Vaccine (6 - 2024-25 season) 04/06/2024*   Mammogram  08/05/2023   Flu Shot  09/16/2023   Pneumonia Vaccine (3 of 3 - PCV20 or PCV21) 05/21/2026   Colon Cancer Screening  07/17/2026   DTaP/Tdap/Td vaccine (4 - Tdap) 05/21/2031   DEXA scan (bone density measurement)  Completed   Hepatitis C Screening  Completed   Zoster (Shingles) Vaccine  Completed   HPV Vaccine  Aged Out   Meningitis B Vaccine  Aged Out  *Topic was postponed. The date shown is not the original due date.   Discussed fall prevention, supplements and exercise for bone density  Pt is not ready to schedule dexa yet- will call for order once she starts using medicare  Encouraged more strength building exercise  PHQ 0         Prediabetes   Lab Results  Component Value Date   HGBA1C 5.8 05/24/2023   HGBA1C 5.9 05/18/2022    disc imp of low glycemic diet and wt  loss to prevent DM2       Obesity   Discussed how this problem influences overall health and the risks it imposes  Reviewed plan for weight loss with lower calorie diet (via better food choices (lower glycemic and portion control) along with exercise building up to or more than 30 minutes 5 days per week including some aerobic activity and strength training         Hyperlipidemia LDL goal <130   Disc goals for lipids and reasons to control them Rev last labs with pt Rev low sat fat diet in detail LDL  of 87 HDL improved/in 50s  Well controlled with atorvastatin 10 mg daily and diet       Colon cancer screening   Colonoscopy 07/2019 with 8 year recall

## 2023-05-30 NOTE — Assessment & Plan Note (Signed)
 Lab Results  Component Value Date   HGBA1C 5.8 05/24/2023   HGBA1C 5.9 05/18/2022    disc imp of low glycemic diet and wt loss to prevent DM2

## 2023-05-30 NOTE — Patient Instructions (Addendum)
 Let us  know when you need an order for bone density test   Keep walking  Add some strength training to your routine, this is important for bone and brain health and can reduce your risk of falls and help your body use insulin properly and regulate weight  Light weights, exercise bands , and internet videos are a good way to start  Yoga (chair or regular), machines , floor exercises or a gym with machines are also good options   No change in medicines   Keep taking care of yourself   Try to get most of your carbohydrates from produce (with the exception of white potatoes) and whole grains Eat less bread/pasta/rice/snack foods/cereals/sweets and other items from the middle of the grocery store (processed carbs)

## 2023-05-30 NOTE — Assessment & Plan Note (Addendum)
 Disc goals for lipids and reasons to control them Rev last labs with pt Rev low sat fat diet in detail LDL of 87 HDL improved/in 40J  Well controlled with atorvastatin 10 mg daily and diet

## 2023-06-17 ENCOUNTER — Telehealth: Payer: Self-pay | Admitting: *Deleted

## 2023-06-17 NOTE — Telephone Encounter (Signed)
 We received surgical clearance forms from [facility] and per Dr. Malissa Se:  "Pt needs EKG before I can sign form"   Please schedule a nurse visit for a EKG when able, thanks

## 2023-06-17 NOTE — Telephone Encounter (Signed)
LVM to schedule nurse visit

## 2023-06-22 NOTE — Telephone Encounter (Signed)
 Yes - would want to do closer to surgical date  Generally the surgeon /anesthesiologist needs a baseline EKG to follow in case something changes during or after surgery

## 2023-06-22 NOTE — Telephone Encounter (Signed)
 Copied from CRM [id number]. Topic: Clinical - Medical Advice >> Jun 22, 2023 12:20 PM Juleen Oakland F wrote: Reason for CRM: Patient returning Amieya call, she will be post poning her orthopedic surgery due to insurance purposes but would like to know why a EKG would be required for leg surgery? She asked for a reply via MyChart if possible

## 2023-06-22 NOTE — Telephone Encounter (Signed)
 Lvmtcb, sent mychart message

## 2023-06-28 ENCOUNTER — Encounter: Payer: Self-pay | Admitting: Family Medicine

## 2023-06-28 ENCOUNTER — Other Ambulatory Visit: Payer: Self-pay | Admitting: Family Medicine

## 2023-06-28 DIAGNOSIS — Z1231 Encounter for screening mammogram for malignant neoplasm of breast: Secondary | ICD-10-CM

## 2023-06-28 DIAGNOSIS — Z1239 Encounter for other screening for malignant neoplasm of breast: Secondary | ICD-10-CM

## 2023-08-08 ENCOUNTER — Ambulatory Visit
Admission: RE | Admit: 2023-08-08 | Discharge: 2023-08-08 | Disposition: A | Discharge status: Home / Self Care | Source: Ambulatory Visit | Attending: Family Medicine | Admitting: Family Medicine

## 2023-08-08 DIAGNOSIS — Z1231 Encounter for screening mammogram for malignant neoplasm of breast: Secondary | ICD-10-CM

## 2023-08-09 ENCOUNTER — Ambulatory Visit: Payer: Self-pay | Admitting: Family Medicine

## 2023-08-13 ENCOUNTER — Other Ambulatory Visit: Payer: Self-pay | Admitting: Family Medicine

## 2024-01-26 ENCOUNTER — Telehealth: Payer: Self-pay | Admitting: *Deleted

## 2024-01-26 NOTE — Telephone Encounter (Signed)
 Pharmacist notified and advised of Dr. Graham comments

## 2024-01-26 NOTE — Telephone Encounter (Signed)
 That is fine if ok with patient  If she has an clinical changes with switch let me know

## 2024-01-26 NOTE — Telephone Encounter (Signed)
 Copied from CRM #[id number]. Topic: Clinical - Prescription Issue >> Jan 26, 2024  9:04 AM Ivette P wrote: Reason for CRM: transfer for levothyroxine  (SYNTHROID ) 75 MCG tablet - for the manufacture is different. and accord to [employer]. need clearance or ok that they can move forward.   Callback: [phone]   [id number]- reference number to find pts case.

## 2024-03-22 ENCOUNTER — Other Ambulatory Visit: Payer: Self-pay | Admitting: Family Medicine

## 2024-05-24 ENCOUNTER — Other Ambulatory Visit

## 2024-05-31 ENCOUNTER — Encounter: Admitting: Family Medicine
